# Patient Record
Sex: Male | Born: 1985 | Race: Black or African American | Hispanic: No | Marital: Single | Smoking: Never smoker
Health system: Southern US, Community
[De-identification: ages and names within clinical notes are randomized; demographics above are authoritative.]

## PROBLEM LIST (undated history)

## (undated) DIAGNOSIS — C499 Malignant neoplasm of connective and soft tissue, unspecified: Secondary | ICD-10-CM

## (undated) DIAGNOSIS — C801 Malignant (primary) neoplasm, unspecified: Secondary | ICD-10-CM

## (undated) HISTORY — PX: APPENDECTOMY: SHX54

---
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
## 2016-12-03 ENCOUNTER — Encounter (HOSPITAL_BASED_OUTPATIENT_CLINIC_OR_DEPARTMENT_OTHER): Payer: Self-pay | Admitting: Emergency Medicine

## 2016-12-03 ENCOUNTER — Emergency Department (HOSPITAL_BASED_OUTPATIENT_CLINIC_OR_DEPARTMENT_OTHER)
Admission: EM | Admit: 2016-12-03 | Discharge: 2016-12-03 | Disposition: A | Discharge status: Home / Self Care | Payer: Self-pay | Attending: Emergency Medicine | Admitting: Emergency Medicine

## 2016-12-03 DIAGNOSIS — K029 Dental caries, unspecified: Secondary | ICD-10-CM | POA: Insufficient documentation

## 2016-12-03 DIAGNOSIS — Z85828 Personal history of other malignant neoplasm of skin: Secondary | ICD-10-CM | POA: Insufficient documentation

## 2016-12-03 HISTORY — DX: Malignant (primary) neoplasm, unspecified: C80.1

## 2016-12-03 MED ORDER — PENICILLIN V POTASSIUM 250 MG PO TABS
500.0000 mg | ORAL_TABLET | Freq: Once | ORAL | Status: AC
  Administered 2016-12-03: 500 mg via ORAL
  Filled 2016-12-03: qty 2

## 2016-12-03 MED ORDER — HYDROCODONE-ACETAMINOPHEN 5-325 MG PO TABS: 1.0000 | ORAL_TABLET | Freq: Four times a day (QID) | ORAL | 0 refills | Status: DC | PRN

## 2016-12-03 MED ORDER — PENICILLIN V POTASSIUM 500 MG PO TABS: 500.0000 mg | ORAL_TABLET | Freq: Four times a day (QID) | ORAL | 0 refills | Status: DC

## 2016-12-03 MED ORDER — BUPIVACAINE-EPINEPHRINE (PF) 0.5% -1:200000 IJ SOLN
1.8000 mL | Freq: Once | INTRAMUSCULAR | Status: AC
  Administered 2016-12-03: 1.8 mL
  Filled 2016-12-03: qty 1.8

## 2016-12-03 NOTE — ED Triage Notes (Addendum)
Pt states ? Cracked tooth or cavity in R upper side. Pt states facial swelling. Denies fever, n,v. No dentist at present, pt states he just started a new job and is waiting on insurance to become effective.

## 2016-12-03 NOTE — ED Provider Notes (Addendum)
   [place] DEPT [facility] Provider Note: Georgena Spurling, MD, FACEP  CSN: [id number] MRN: [id number] ARRIVAL: 12/03/16 at [place]: [place]  Dental Pain   HISTORY OF PRESENT ILLNESS  12/03/16 3:37 AM Jonathan Lucero is a 31 y.o. male carious right upper first molar that has become painful over the past several days. He rates the pain as a 10 out of 10. It is worse with eating. The pain radiates to his right maxillary area. There is no associated fever or lymphadenopathy.  Consultation with the [facility] state controlled substances database reveals the patient has received No opioid prescriptions in the past year.   Past Medical History:  Diagnosis Date  . Cancer [facility])    skin cancer as a child    Past Surgical History:  Procedure Laterality Date  . APPENDECTOMY      No family history on file.  Social History  Substance Use Topics  . Smoking status: Never Smoker  . Smokeless tobacco: Never Used  . Alcohol use Yes     Comment: social    Prior to Admission medications   Not on File    Allergies Ativan [lorazepam]   REVIEW OF SYSTEMS  Negative except as noted here or in the History of Present Illness.   PHYSICAL EXAMINATION  Initial Vital Signs Blood pressure (!) 150/87, pulse 66, temperature 98.8 F (37.1 C), temperature source Oral, resp. rate 16, height 5\' 9"  (1.753 m), weight 59 kg (130 lb), SpO2 100 %.  Examination General: Well-developed, well-nourished male in no acute distress; appearance consistent with age of record HENT: normocephalic; atraumatic; poor dentition; carious right upper first molar with tenderness to percussion Eyes: Normal appearance Neck: supple no lymphadenopathy Heart: regular rate and rhythm Lungs: clear to auscultation bilaterally Abdomen: soft; nondistended; nontender; bowel sounds present Extremities: No deformity; full range of motion Neurologic: Awake, alert and oriented; motor function intact in all  extremities and symmetric; no facial droop Skin: Warm and dry Psychiatric: Normal mood and affect   RESULTS  Summary of this visit's results, reviewed by myself:   EKG Interpretation  Date/Time:    Ventricular Rate:    PR Interval:    QRS Duration:   QT Interval:    QTC Calculation:   R Axis:     Text Interpretation:        Laboratory Studies: No results found for this or any previous visit (from the past 24 hour(s)). Imaging Studies: No results found.  ED COURSE  Nursing notes and initial vitals signs, including pulse oximetry, reviewed.  Vitals:   12/03/16 0225 12/03/16 0226  BP:  (!) 150/87  Pulse:  66  Resp:  16  Temp:  98.8 F (37.1 C)  TempSrc:  Oral  SpO2:  100%  Weight: 59 kg (130 lb)   Height: 5\' 9"  (1.753 m)     PROCEDURES   DENTAL BLOCK 1.8 milliliters of 0.5% bupivacaine with epinephrine were injected into the buccal fold adjacent to the right upper first molar. The patient tolerated this well and there were no immediate complications. Adequate analgesia was obtained.   ED DIAGNOSES     ICD-10-CM   1. Pain due to dental caries K02.9        Jo Booze, MD 12/03/16 0347    Shanon Rosser, MD 12/03/16 [phone]

## 2017-01-21 ENCOUNTER — Emergency Department (HOSPITAL_BASED_OUTPATIENT_CLINIC_OR_DEPARTMENT_OTHER): Payer: Self-pay

## 2017-01-21 ENCOUNTER — Emergency Department (HOSPITAL_BASED_OUTPATIENT_CLINIC_OR_DEPARTMENT_OTHER)
Admission: EM | Admit: 2017-01-21 | Discharge: 2017-01-21 | Disposition: A | Discharge status: Home / Self Care | Payer: Self-pay | Attending: Emergency Medicine | Admitting: Emergency Medicine

## 2017-01-21 ENCOUNTER — Encounter (HOSPITAL_BASED_OUTPATIENT_CLINIC_OR_DEPARTMENT_OTHER): Payer: Self-pay

## 2017-01-21 DIAGNOSIS — Y999 Unspecified external cause status: Secondary | ICD-10-CM | POA: Insufficient documentation

## 2017-01-21 DIAGNOSIS — Y939 Activity, unspecified: Secondary | ICD-10-CM | POA: Insufficient documentation

## 2017-01-21 DIAGNOSIS — S39012A Strain of muscle, fascia and tendon of lower back, initial encounter: Secondary | ICD-10-CM | POA: Insufficient documentation

## 2017-01-21 DIAGNOSIS — X501XXA Overexertion from prolonged static or awkward postures, initial encounter: Secondary | ICD-10-CM | POA: Insufficient documentation

## 2017-01-21 DIAGNOSIS — Y92008 Other place in unspecified non-institutional (private) residence as the place of occurrence of the external cause: Secondary | ICD-10-CM | POA: Insufficient documentation

## 2017-01-21 LAB — URINALYSIS, ROUTINE W REFLEX MICROSCOPIC
Bilirubin Urine: NEGATIVE
Glucose, UA: NEGATIVE mg/dL
Hgb urine dipstick: NEGATIVE
Ketones, ur: NEGATIVE mg/dL
LEUKOCYTES UA: NEGATIVE
NITRITE: NEGATIVE
PROTEIN: NEGATIVE mg/dL
Specific Gravity, Urine: 1.02 (ref 1.005–1.030)
pH: 8 (ref 5.0–8.0)

## 2017-01-21 MED ORDER — NAPROXEN 500 MG PO TABS: 500.0000 mg | ORAL_TABLET | Freq: Two times a day (BID) | ORAL | 0 refills | Status: DC

## 2017-01-21 MED ORDER — CYCLOBENZAPRINE HCL 10 MG PO TABS: 10.0000 mg | ORAL_TABLET | Freq: Two times a day (BID) | ORAL | 0 refills | Status: DC | PRN

## 2017-01-21 NOTE — ED Notes (Signed)
Pt verbalizes understanding of d/c instructions and denies any further needs at this time. 

## 2017-01-21 NOTE — ED Provider Notes (Signed)
[facility] EMERGENCY DEPARTMENT Provider Note   CSN: [id number] Arrival date & time: 01/21/17  1754     History   Chief Complaint Chief Complaint  Patient presents with  . Back Pain    HPI Jonathan Lucero is a 31 y.o. male.  HPI Patient presents to ED for evaluation of bilateral lower back pain that occurred starting yesterday. He was standing up from a seated positionand his couch when he began experiencing the back pain. States that it is worse with movement and described as throbbing. He has tried ibuprofen with mild relief in symptoms. He does have a history of similar symptoms in the past due to muscle strain. He has been ambulatory but pain worse with ambulation. He denies any previous back surgery, history of IV drug use, numbness in legs, saddle anesthesia, loss of bladder function, issues with urination. 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  Past Medical History:  Diagnosis Date  . Cancer ([place])    skin cancer as a child    There are no active problems to display for this patient.   Past Surgical History:  Procedure Laterality Date  . APPENDECTOMY         Home Medications    Prior to Admission medications   Medication Sig Start Date End Date Taking? Authorizing Provider  cyclobenzaprine (FLEXERIL) 10 MG tablet Take 1 tablet (10 mg total) by mouth 2 (two) times daily as needed for muscle spasms. 01/21/17   Mekaela Azizi, PA-C  naproxen (NAPROSYN) 500 MG tablet Take 1 tablet (500 mg total) by mouth 2 (two) times daily. 01/21/17   Delia Heady, PA-C    Family History No family history on file.  Social History Social History  Substance Use Topics  . Smoking status: Never Smoker  . Smokeless tobacco: Never Used  . Alcohol use Yes     Comment: social     Allergies   Ativan [lorazepam]   Review of Systems Review of Systems  Constitutional: Negative for chills and fever.    Gastrointestinal: Negative for nausea and vomiting.  Genitourinary: Positive for flank pain. Negative for discharge, dysuria, penile pain, penile swelling and urgency.  Musculoskeletal: Positive for back pain. Negative for arthralgias and myalgias.  Skin: Negative for rash.  Neurological: Negative for weakness and numbness.     Physical Exam Updated Vital Signs BP 123/73 (BP Location: Right Arm)   Pulse 80   Temp 98.3 F (36.8 C) (Oral)   Resp 18   Ht 5\' 9"  (1.753 m)   Wt 56.7 kg (125 lb)   SpO2 100%   BMI 18.46 kg/m   Physical Exam  Constitutional: He appears well-developed and well-nourished. No distress.  HENT:  Head: Normocephalic and atraumatic.  Eyes: Conjunctivae and EOM are normal. No scleral icterus.  Neck: Normal range of motion.  Pulmonary/Chest: Effort normal. No respiratory distress.  Musculoskeletal: Normal range of motion. He exhibits tenderness. He exhibits no edema or deformity.       Arms: Tenderness to palpation of bilateral paraspinal musculature in the lumbar area. Bilateral CVA tenderness noted. No midline spinal tenderness present in lumbar, thoracic or cervical spine. No step-off palpated. No visible bruising, edema or temperature change noted. No objective signs of numbness present. No saddle anesthesia. 2+ DP pulses bilaterally. Sensation intact to light touch. Strength 5/5 in bilateral lower extremities.  Neurological: He is alert.  Skin: No rash noted. He is not diaphoretic.  Psychiatric: He has a normal mood and affect.  Nursing note and vitals reviewed.    ED Treatments / Results  Labs (all labs ordered are listed, but only abnormal results are displayed) Labs Reviewed  URINALYSIS, ROUTINE W REFLEX MICROSCOPIC    EKG  EKG Interpretation None       Radiology Dg Lumbar Spine Complete  Result Date: 01/21/2017 CLINICAL DATA:  31 y/o  M; bilateral lower back pain. EXAM: LUMBAR SPINE - COMPLETE 4+ VIEW COMPARISON:  None. FINDINGS: Mild  rotatory dextrocurvature of lumbar spine with apex at L4. Normal lumbar lordosis without listhesis. No loss of vertebral body height. Mild loss of L4-5 and L5-S1 intervertebral disc space heights. Surgical clips project over the right upper quadrant. IMPRESSION: 1. No acute fracture or dislocation. 2. Mild lumbar spine rotatory dextrocurvature and discogenic degenerative changes at L4-5 and L5-S1 level. Electronically Signed   By: Kristine Garbe M.D.   On: 01/21/2017 20:20    Procedures Procedures (including critical care time)  Medications Ordered in ED Medications - No data to display   Initial Impression / Assessment and Plan / ED Course  I have reviewed the triage vital signs and the nursing notes.  Pertinent labs & imaging results that were available during my care of the patient were reviewed by me and considered in my medical decision making (see chart for details).     Patient presents to ED for evaluation of bilateral lower back pain that occurred starting yesterday. P.m. Began after standing up from a seated position yesterday. Pain is worse with movement and described as throbbing. Mild improvement in symptoms with ibuprofen. History of similar symptoms in the past due to muscle strain. On physical exam there is tenderness to palpation as indicated in the area above. There are no signs of numbness present, no saddle anesthesia, normal strength noted bilaterally and sensation intact to light touch. No previous back surgery, history of IV drug use, urinary incontinence noted.Patient is otherwise nontoxic appearing and in no acute distress. X-ray of lumbar spine returned as negative. Urinalysis with no evidence of UTI or hematuria. I suspect that his symptoms are due to a muscle strain rather than cauda equina or other acute spinal cord injury. We'll give atient naproxen and Flexeril to be taken and advised heat therapy and stretching as tolerated. Patient appears stable for  discharge at this time. Strict return precautions given.  Final Clinical Impressions(s) / ED Diagnoses   Final diagnoses:  Strain of lumbar region, initial encounter    New Prescriptions There are no discharge medications for this patient.    Delia Heady, PA-C 01/22/17 9030    Blanchie Dessert, MD 01/22/17 2215

## 2017-01-21 NOTE — ED Triage Notes (Signed)
C/o lower back pain-started yesterday-denies injury-NAD-steady gait

## 2017-01-21 NOTE — Discharge Instructions (Signed)
Please read attached information regarding your condition. Take naproxen and Flexeril as needed for pain. Apply heat and stretch area as directed. Return to ED for worsening back pain, falls, injuries, numbness in legs, trouble walking, loss of bladder function.

## 2017-08-26 ENCOUNTER — Emergency Department (HOSPITAL_BASED_OUTPATIENT_CLINIC_OR_DEPARTMENT_OTHER)
Admission: EM | Admit: 2017-08-26 | Discharge: 2017-08-26 | Disposition: A | Discharge status: Home / Self Care | Payer: Self-pay | Attending: Physician Assistant | Admitting: Physician Assistant

## 2017-08-26 ENCOUNTER — Other Ambulatory Visit: Payer: Self-pay

## 2017-08-26 ENCOUNTER — Emergency Department (HOSPITAL_BASED_OUTPATIENT_CLINIC_OR_DEPARTMENT_OTHER): Payer: Self-pay

## 2017-08-26 ENCOUNTER — Encounter (HOSPITAL_BASED_OUTPATIENT_CLINIC_OR_DEPARTMENT_OTHER): Payer: Self-pay | Admitting: Emergency Medicine

## 2017-08-26 DIAGNOSIS — R222 Localized swelling, mass and lump, trunk: Secondary | ICD-10-CM | POA: Insufficient documentation

## 2017-08-26 DIAGNOSIS — Z85831 Personal history of malignant neoplasm of soft tissue: Secondary | ICD-10-CM | POA: Insufficient documentation

## 2017-08-26 DIAGNOSIS — I890 Lymphedema, not elsewhere classified: Secondary | ICD-10-CM

## 2017-08-26 DIAGNOSIS — Z79899 Other long term (current) drug therapy: Secondary | ICD-10-CM | POA: Insufficient documentation

## 2017-08-26 HISTORY — DX: Malignant neoplasm of connective and soft tissue, unspecified: C49.9

## 2017-08-26 LAB — COMPREHENSIVE METABOLIC PANEL
ALT: 12 U/L — ABNORMAL LOW (ref 17–63)
AST: 22 U/L (ref 15–41)
Albumin: 4.3 g/dL (ref 3.5–5.0)
Alkaline Phosphatase: 70 U/L (ref 38–126)
Anion gap: 10 (ref 5–15)
BUN: 12 mg/dL (ref 6–20)
CO2: 25 mmol/L (ref 22–32)
Calcium: 9 mg/dL (ref 8.9–10.3)
Chloride: 103 mmol/L (ref 101–111)
Creatinine, Ser: 1.58 mg/dL — ABNORMAL HIGH (ref 0.61–1.24)
GFR, EST NON AFRICAN AMERICAN: 56 mL/min — AB (ref >60)
Glucose, Bld: 134 mg/dL — ABNORMAL HIGH (ref 65–99)
POTASSIUM: 3.9 mmol/L (ref 3.5–5.1)
Sodium: 138 mmol/L (ref 135–145)
Total Bilirubin: 0.5 mg/dL (ref 0.3–1.2)
Total Protein: 8.2 g/dL — ABNORMAL HIGH (ref 6.5–8.1)

## 2017-08-26 LAB — CBC WITH DIFFERENTIAL/PLATELET
BASOS PCT: 0 %
Basophils Absolute: 0 10*3/uL (ref 0.0–0.1)
EOS ABS: 0.2 10*3/uL (ref 0.0–0.7)
EOS PCT: 3 %
HCT: 39 % (ref 39.0–52.0)
Hemoglobin: 13.4 g/dL (ref 13.0–17.0)
Lymphocytes Relative: 23 %
Lymphs Abs: 1.5 10*3/uL (ref 0.7–4.0)
MCH: 29.5 pg (ref 26.0–34.0)
MCHC: 34.4 g/dL (ref 30.0–36.0)
MCV: 85.9 fL (ref 78.0–100.0)
MONOS PCT: 16 %
Monocytes Absolute: 1 10*3/uL (ref 0.1–1.0)
NEUTROS PCT: 58 %
Neutro Abs: 3.7 10*3/uL (ref 1.7–7.7)
Platelets: 180 10*3/uL (ref 150–400)
RBC: 4.54 MIL/uL (ref 4.22–5.81)
RDW: 13.3 % (ref 11.5–15.5)
WBC: 6.3 10*3/uL (ref 4.0–10.5)

## 2017-08-26 MED ORDER — IOPAMIDOL (ISOVUE-300) INJECTION 61%
100.0000 mL | Freq: Once | INTRAVENOUS | Status: AC | PRN
  Administered 2017-08-26: 100 mL via INTRAVENOUS

## 2017-08-26 MED ORDER — IOPAMIDOL (ISOVUE-300) INJECTION 61%: 15.0000 mL | Freq: Once | INTRAVENOUS | Status: DC | PRN

## 2017-08-26 NOTE — Discharge Instructions (Addendum)
It is unclear what this mass in your supraclavicular area is.  We have printed out the results for you.  We recommend that you call today to follow-up with your primary oncologist at [facility] will likely need an MRI to better characterize this.  You also may need a biopsy.  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

## 2017-08-26 NOTE — ED Triage Notes (Signed)
patient states that he has had a spot on his neck x 2 weeks

## 2017-08-26 NOTE — ED Notes (Signed)
Pt has swollen area around the supraclavicular lymph node area.  Denies fever.  Denies trauma.  States it has been present for 2 weeks.

## 2017-08-26 NOTE — ED Provider Notes (Addendum)
[facility] EMERGENCY DEPARTMENT Provider Note   CSN: [id number] Arrival date & time: 08/26/17  0751     History   Chief Complaint Chief Complaint  Patient presents with  . Lymphadenopathy    HPI Jonathan Lucero is a 32 y.o. male.  HPI   Patient is a 32 year old male presenting with swelling to his left neck.  The swelling is located over his left super clavicular laterally.  Patient reports is been on for last month.  And getting larger.  Patient reports mild allergy-like symptoms.  Otherwise no history of fevers.  No issues otherwise.  Patient denies any recent weight loss.  Denies any B symptoms such as night sweats.  He reports feeling his usual state of health.  Past Medical History:  Diagnosis Date  . Cancer ([place])    skin cancer as a child  . 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   Past Surgical History:  Procedure Laterality Date  . APPENDECTOMY          Home Medications    Prior to Admission medications   Medication Sig Start Date End Date Taking? Authorizing Provider  cyclobenzaprine (FLEXERIL) 10 MG tablet Take 1 tablet (10 mg total) by mouth 2 (two) times daily as needed for muscle spasms. 01/21/17   Khatri, Hina, PA-C  naproxen (NAPROSYN) 500 MG tablet Take 1 tablet (500 mg total) by mouth 2 (two) times daily. 01/21/17   Delia Heady, PA-C    Family History History reviewed. No pertinent family history.  Social History Social History   Tobacco Use  . Smoking status: Never Smoker  . Smokeless tobacco: Never Used  Substance Use Topics  . Alcohol use: Yes    Comment: social  . Drug use: Yes    Types: Marijuana     Allergies   Ativan [lorazepam]   Review of Systems Review of Systems  Constitutional: Negative for activity change, appetite change, fatigue, fever and unexpected weight change.  HENT: Negative for congestion, mouth sores, sinus pressure, sore throat, trouble swallowing and  voice change.   Respiratory: Negative for shortness of breath.   Cardiovascular: Negative for chest pain.  Gastrointestinal: Negative for abdominal pain.  Genitourinary: Negative for difficulty urinating.  Musculoskeletal: Negative for back pain.  Allergic/Immunologic: Negative for immunocompromised state.  All other systems reviewed and are negative.    Physical Exam Updated Vital Signs BP 130/78   Pulse 75   Temp 97.7 F (36.5 C) (Oral)   Resp 16   Ht 5\' 9"  (1.753 m)   Wt 56.7 kg (125 lb)   SpO2 99%   BMI 18.46 kg/m   Physical Exam  Constitutional: He is oriented to person, place, and time. He appears well-nourished.  HENT:  Head: Normocephalic.  Eyes: Conjunctivae are normal.  Cardiovascular: Normal rate and regular rhythm.  No murmur heard. Pulmonary/Chest: Effort normal and breath sounds normal. No stridor. No respiratory distress.  Swelling to the left supraclavicular area.  Nonmobile.  Nontender.  4 x 4 mass.  Abdominal: Soft. He exhibits no distension. There is no tenderness.  Neurological: He is oriented to person, place, and time. No cranial nerve deficit.  Skin: Skin is warm and dry. He is not diaphoretic.  Psychiatric: He has a normal mood and affect. His behavior is normal.     ED Treatments / Results  Labs (all labs ordered are listed, but only abnormal results are displayed) Labs Reviewed  COMPREHENSIVE METABOLIC  PANEL - Abnormal; Notable for the following components:      Result Value   Glucose, Bld 134 (*)    Creatinine, Ser 1.58 (*)    Total Protein 8.2 (*)    ALT 12 (*)    GFR calc non Af Amer 56 (*)    All other components within normal limits  CBC WITH DIFFERENTIAL/PLATELET    EKG None  Radiology Ct Soft Tissue Neck W Contrast  Result Date: 08/26/2017 CLINICAL DATA:  Left supraclavicular lump. 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 EXAM: CT NECK WITH CONTRAST TECHNIQUE: Multidetector CT imaging of the neck was  performed using the standard protocol following the bolus administration of intravenous contrast. CONTRAST:  17mL ISOVUE-300 IOPAMIDOL (ISOVUE-300) INJECTION 61% COMPARISON:  CT chest with contrast 08/25/2014 FINDINGS: Pharynx and larynx: No focal mucosal or submucosal lesions are present. Salivary glands: Submandibular and parotid glands are normal bilaterally. Thyroid: Normal Lymph nodes: No significant upper cervical adenopathy is present. A heterogeneous left supraclavicular mass measures 6.1 x 2.9 x 2.5 cm. A smaller more homogeneous lesion was present on the prior exam. Vascular: Within normal limits Limited intracranial: Unremarkable Visualized orbits: Within normal limits. No optic nerve mass is present. Mastoids and visualized paranasal sinuses: A polyp or mucous retention cyst is present inferiorly within the left maxillary sinus. Skeleton: There is some reversal of the normal cervical lordosis. Vertebral body heights are maintained. Alignment is anatomic. Sphenoid bone is normal bilaterally. Mandible is intact and located. Upper chest: The lung apices are clear. Other: Nerve roots appear to be enlarged bilaterally. IMPRESSION: 1. Heterogeneous mass lesion in the left supraclavicular station measuring 6.1 x 2.9 x 2.5 cm. 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 A cystic lesion was present previously. This may represent a degenerated schwannoma or plexiform neurofibroma. Lymphangioma is considered less likely. Although the patient has a distant history of rhabdomyosarcoma, metastatic disease is considered unlikely. Recommend MRI of the neck without and with contrast for further evaluation prior to biopsy. 2. No other significant cervical adenopathy or other primary lesion. Electronically Signed   By: San Morelle M.D.   On: 08/26/2017 09:43   Ct Chest W Contrast  Result Date: 08/26/2017 CLINICAL DATA:  History of left supraclavicular palpable abnormality EXAM: CT  CHEST, ABDOMEN, AND PELVIS WITH CONTRAST TECHNIQUE: Multidetector CT imaging of the chest, abdomen and pelvis was performed following the standard protocol during bolus administration of intravenous contrast. CONTRAST:  184mL ISOVUE-300 IOPAMIDOL (ISOVUE-300) INJECTION 61% COMPARISON:  None. FINDINGS: CT CHEST FINDINGS Cardiovascular: Thoracic aorta is within normal limits. No cardiac enlargement is seen. Pulmonary artery as visualized is within normal limits. Mediastinum/Nodes: Thoracic inlet demonstrates evidence of a 4.4 x 2.4 cm mixed enhancing lesion in the left supraclavicular region. No lymphadenopathy is noted. The hila and mediastinum show no significant adenopathy. The esophagus is within normal limits. No axillary adenopathy is identified. Lungs/Pleura: Lungs are well aerated bilaterally. Stable right middle lobe nodule is seen with mild calcification within. Lungs are otherwise clear without focal infiltrate or sizable effusion. Musculoskeletal: No acute bony abnormality is noted. Stable changes in the scapula bilaterally are seen. CT ABDOMEN PELVIS FINDINGS Hepatobiliary: No focal liver abnormality is seen. No gallstones, gallbladder wall thickening, or biliary dilatation. Pancreas: Unremarkable. No pancreatic ductal dilatation or surrounding inflammatory changes. Spleen: Normal in size without focal abnormality. Adrenals/Urinary Tract: The right adrenal gland and kidney have been surgically removed consistent with the given clinical history. The left kidney  is within normal limits. The bladder is well distended. Stomach/Bowel: Appendix is not visualized consistent with prior surgical history. Fecal material is noted throughout the colon without obstructive change. No inflammatory abnormalities of the bowel are seen. Vascular/Lymphatic: No significant vascular findings are present. No enlarged abdominal or pelvic lymph nodes. Reproductive: Prostate is unremarkable. Other: No abdominal wall hernia or  abnormality. No abdominopelvic ascites. Musculoskeletal: No acute or significant osseous findings. IMPRESSION: 4.4 x 2.4 cm enhancing lesion in the left supraclavicular region. When compared with the prior CT examination, this lesion was present but not well appreciated due to the timing of the contrast bolus. MR of the neck may be helpful for further workup on a non emergent basis Postsurgical changes consistent with the given clinical history. Stable right middle lobe nodule. Stable bony changes as previously described. Electronically Signed   By: Inez Catalina M.D.   On: 08/26/2017 09:42   Ct Abdomen Pelvis W Contrast  Result Date: 08/26/2017 CLINICAL DATA:  History of left supraclavicular palpable abnormality EXAM: CT CHEST, ABDOMEN, AND PELVIS WITH CONTRAST TECHNIQUE: Multidetector CT imaging of the chest, abdomen and pelvis was performed following the standard protocol during bolus administration of intravenous contrast. CONTRAST:  152mL ISOVUE-300 IOPAMIDOL (ISOVUE-300) INJECTION 61% COMPARISON:  None. FINDINGS: CT CHEST FINDINGS Cardiovascular: Thoracic aorta is within normal limits. No cardiac enlargement is seen. Pulmonary artery as visualized is within normal limits. Mediastinum/Nodes: Thoracic inlet demonstrates evidence of a 4.4 x 2.4 cm mixed enhancing lesion in the left supraclavicular region. No lymphadenopathy is noted. The hila and mediastinum show no significant adenopathy. The esophagus is within normal limits. No axillary adenopathy is identified. Lungs/Pleura: Lungs are well aerated bilaterally. Stable right middle lobe nodule is seen with mild calcification within. Lungs are otherwise clear without focal infiltrate or sizable effusion. Musculoskeletal: No acute bony abnormality is noted. Stable changes in the scapula bilaterally are seen. CT ABDOMEN PELVIS FINDINGS Hepatobiliary: No focal liver abnormality is seen. No gallstones, gallbladder wall thickening, or biliary dilatation. Pancreas:  Unremarkable. No pancreatic ductal dilatation or surrounding inflammatory changes. Spleen: Normal in size without focal abnormality. Adrenals/Urinary Tract: The right adrenal gland and kidney have been surgically removed consistent with the given clinical history. The left kidney is within normal limits. The bladder is well distended. Stomach/Bowel: Appendix is not visualized consistent with prior surgical history. Fecal material is noted throughout the colon without obstructive change. No inflammatory abnormalities of the bowel are seen. Vascular/Lymphatic: No significant vascular findings are present. No enlarged abdominal or pelvic lymph nodes. Reproductive: Prostate is unremarkable. Other: No abdominal wall hernia or abnormality. No abdominopelvic ascites. Musculoskeletal: No acute or significant osseous findings. IMPRESSION: 4.4 x 2.4 cm enhancing lesion in the left supraclavicular region. When compared with the prior CT examination, this lesion was present but not well appreciated due to the timing of the contrast bolus. MR of the neck may be helpful for further workup on a non emergent basis Postsurgical changes consistent with the given clinical history. Stable right middle lobe nodule. Stable bony changes as previously described. Electronically Signed   By: Inez Catalina M.D.   On: 08/26/2017 09:42    Procedures Procedures (including critical care time)  Medications Ordered in ED Medications  iopamidol (ISOVUE-300) 61 % injection 100 mL (100 mLs Intravenous Contrast Given 08/26/17 0906)     Initial Impression / Assessment and Plan / ED Course  I have reviewed the triage vital signs and the nursing notes.  Pertinent labs & imaging results that  were available during my care of the patient were reviewed by me and considered in my medical decision making (see chart for details).    Patient is a 32 year old male presenting with swelling to his left neck.  The swelling is located over his left  super clavicular laterally.  Patient reports is been on for last month.  And getting larger.  Patient reports mild allergy-like symptoms.  Otherwise no history of fevers.  No issues otherwise.  Patient denies any recent weight loss.  Denies any B symptoms such as night sweats.  He reports feeling his usual state of health.  3:41 PM Swelling concerning for Virch[place] node.  Will get CT abdomen and pelvis in addition to the CT neck.  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  Concern for recurrence..  CT shows just large surpaclavicular mass. Discussed with patient, CT results printed. He will call oncologist today for follow up and other imaging.    Final Clinical Impressions(s) / ED Diagnoses   Final diagnoses:  Supraclavicular mass    ED Discharge Orders    None       Macarthur Critchley, MD 08/26/17 1541    Macarthur Critchley, MD 08/26/17 1541

## 2019-02-13 ENCOUNTER — Encounter (HOSPITAL_BASED_OUTPATIENT_CLINIC_OR_DEPARTMENT_OTHER): Payer: Self-pay | Admitting: Emergency Medicine

## 2019-02-13 ENCOUNTER — Other Ambulatory Visit: Payer: Self-pay

## 2019-02-13 ENCOUNTER — Emergency Department (HOSPITAL_BASED_OUTPATIENT_CLINIC_OR_DEPARTMENT_OTHER): Payer: BC Managed Care – PPO

## 2019-02-13 ENCOUNTER — Emergency Department (HOSPITAL_BASED_OUTPATIENT_CLINIC_OR_DEPARTMENT_OTHER)
Admission: EM | Admit: 2019-02-13 | Discharge: 2019-02-13 | Disposition: A | Discharge status: Home / Self Care | Payer: BC Managed Care – PPO | Attending: Emergency Medicine | Admitting: Emergency Medicine

## 2019-02-13 DIAGNOSIS — M899 Disorder of bone, unspecified: Secondary | ICD-10-CM | POA: Insufficient documentation

## 2019-02-13 DIAGNOSIS — K769 Liver disease, unspecified: Secondary | ICD-10-CM | POA: Diagnosis not present

## 2019-02-13 DIAGNOSIS — R1013 Epigastric pain: Secondary | ICD-10-CM | POA: Diagnosis present

## 2019-02-13 LAB — COMPREHENSIVE METABOLIC PANEL
ALT: 17 U/L (ref 0–44)
AST: 24 U/L (ref 15–41)
Albumin: 4.3 g/dL (ref 3.5–5.0)
Alkaline Phosphatase: 68 U/L (ref 38–126)
Anion gap: 9 (ref 5–15)
BUN: 16 mg/dL (ref 6–20)
CO2: 27 mmol/L (ref 22–32)
Calcium: 9.3 mg/dL (ref 8.9–10.3)
Chloride: 103 mmol/L (ref 98–111)
Creatinine, Ser: 1.41 mg/dL — ABNORMAL HIGH (ref 0.61–1.24)
GFR calc Af Amer: >60 mL/min (ref >60)
GFR calc non Af Amer: >60 mL/min (ref >60)
Glucose, Bld: 99 mg/dL (ref 70–99)
Potassium: 3.9 mmol/L (ref 3.5–5.1)
Sodium: 139 mmol/L (ref 135–145)
Total Bilirubin: 0.6 mg/dL (ref 0.3–1.2)
Total Protein: 8.2 g/dL — ABNORMAL HIGH (ref 6.5–8.1)

## 2019-02-13 LAB — CBC WITH DIFFERENTIAL/PLATELET
Abs Immature Granulocytes: 0.01 10*3/uL (ref 0.00–0.07)
Basophils Absolute: 0.1 10*3/uL (ref 0.0–0.1)
Basophils Relative: 1 %
Eosinophils Absolute: 0.5 10*3/uL (ref 0.0–0.5)
Eosinophils Relative: 5 %
HCT: 41.2 % (ref 39.0–52.0)
Hemoglobin: 13.1 g/dL (ref 13.0–17.0)
Immature Granulocytes: 0 %
Lymphocytes Relative: 28 %
Lymphs Abs: 2.7 10*3/uL (ref 0.7–4.0)
MCH: 28.8 pg (ref 26.0–34.0)
MCHC: 31.8 g/dL (ref 30.0–36.0)
MCV: 90.5 fL (ref 80.0–100.0)
Monocytes Absolute: 0.8 10*3/uL (ref 0.1–1.0)
Monocytes Relative: 8 %
Neutro Abs: 5.7 10*3/uL (ref 1.7–7.7)
Neutrophils Relative %: 58 %
Platelets: 253 10*3/uL (ref 150–400)
RBC: 4.55 MIL/uL (ref 4.22–5.81)
RDW: 14.1 % (ref 11.5–15.5)
WBC: 9.7 10*3/uL (ref 4.0–10.5)
nRBC: 0 % (ref 0.0–0.2)

## 2019-02-13 LAB — URINALYSIS, ROUTINE W REFLEX MICROSCOPIC
Bilirubin Urine: NEGATIVE
Glucose, UA: NEGATIVE mg/dL
Hgb urine dipstick: NEGATIVE
Ketones, ur: NEGATIVE mg/dL
Leukocytes,Ua: NEGATIVE
Nitrite: NEGATIVE
Protein, ur: NEGATIVE mg/dL
Specific Gravity, Urine: 1.015 (ref 1.005–1.030)
pH: 8 (ref 5.0–8.0)

## 2019-02-13 LAB — D-DIMER, QUANTITATIVE (NOT AT ARMC): D-Dimer, Quant: 1.01 ug/mL-FEU — ABNORMAL HIGH (ref 0.00–0.50)

## 2019-02-13 LAB — TROPONIN I (HIGH SENSITIVITY): Troponin I (High Sensitivity): 3 ng/L (ref <18)

## 2019-02-13 LAB — LIPASE, BLOOD: Lipase: 24 U/L (ref 11–51)

## 2019-02-13 MED ORDER — HYDROCODONE-ACETAMINOPHEN 5-325 MG PO TABS: 1.0000 | ORAL_TABLET | Freq: Four times a day (QID) | ORAL | 0 refills | Status: DC | PRN

## 2019-02-13 MED ORDER — ONDANSETRON 8 MG PO TBDP: 8.0000 mg | ORAL_TABLET | Freq: Three times a day (TID) | ORAL | 0 refills | Status: DC | PRN

## 2019-02-13 MED ORDER — ONDANSETRON HCL 4 MG/2ML IJ SOLN
4.0000 mg | Freq: Once | INTRAMUSCULAR | Status: AC
  Administered 2019-02-13: 4 mg via INTRAVENOUS
  Filled 2019-02-13: qty 2

## 2019-02-13 MED ORDER — IOHEXOL 350 MG/ML SOLN
75.0000 mL | Freq: Once | INTRAVENOUS | Status: AC | PRN
  Administered 2019-02-13: 75 mL via INTRAVENOUS

## 2019-02-13 NOTE — ED Triage Notes (Signed)
Pt c/o epigastric pain since yesterday. Pt also c/o right axillary pain and pressure in chest. Pt reports vomiting prior to coming to ED

## 2019-02-13 NOTE — ED Notes (Signed)
ED Provider at bedside. 

## 2019-02-13 NOTE — ED Provider Notes (Signed)
[facility] [facility] Provider Note: Georgena Spurling, MD, FACEP  CSN: [id number] MRN: [id number] ARRIVAL: 02/13/19 at [place]: [place]  Abdominal Pain   HISTORY OF PRESENT ILLNESS  02/13/19 2:42 AM Jonathan Lucero is a 33 y.o. 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  So far as he knows he is now tumor free.  He is here with abdominal pain to the right of his epigastrium (up under the right lower costal margin) that has been present for about a week.  It worsened yesterday evening.  He describes it as a dull pain which he rates as an 8 out of 10.  It is not changed with movement or palpation, but slightly worse with deep breathing.  He had one episode of vomiting just prior to arrival but is not currently nauseated.  He has had no diarrhea with this.  Since yesterday evening he has also had a right, mild, pressure in his chest which is not changed with palpation or breathing.  He also has a similar pain in the area of his right scapula, also not changed with movement or deep breathing.  He is not short of breath nor coughing.   Past Medical History:  Diagnosis Date   Cancer ([place])    skin cancer as a child   Rhabdosarcoma [facility])     Past Surgical History:  Procedure Laterality Date   APPENDECTOMY      No family history on file.  Social History   Tobacco Use   Smoking status: Never Smoker   Smokeless tobacco: Never Used  Substance Use Topics   Alcohol use: Yes    Comment: social   Drug use: Yes    Types: Marijuana    Prior to Admission medications   Not on File    Allergies Ativan [lorazepam]   REVIEW OF SYSTEMS  Negative except as noted here or in the History of Present Illness.   PHYSICAL EXAMINATION  Initial Vital Signs Blood pressure (!) 148/93, pulse (!) 48, temperature 98.2 F (36.8 C), temperature source Oral, resp. rate  14, height 5\' 9"  (1.753 m), weight 59 kg, SpO2 100 %.  Examination General: Well-developed, thin but muscular male in no acute distress; appearance consistent with age of record HENT: normocephalic; atraumatic Eyes: pupils equal, round and reactive to light; extraocular muscles intact Neck: supple Heart: regular rate and rhythm Lungs: clear to auscultation bilaterally Chest: Nontender Abdomen: soft; nondistended; nontender; no masses or hepatosplenomegaly; bowel sounds present Back: Nontender Extremities: No deformity; full range of motion; pulses normal; no edema Neurologic: Awake, alert and oriented; motor function intact in all extremities and symmetric; no facial droop Skin: Warm and dry Psychiatric: Normal mood and affect   RESULTS  Summary of this visit's results, reviewed and interpreted by myself:   EKG Interpretation  Date/Time:  Friday February 13 2019 02:37:48 EST Ventricular Rate:  52 PR Interval:    QRS Duration: 75 QT Interval:  425 QTC Calculation: 396 R Axis:   79 Text Interpretation: Sinus rhythm Consider left ventricular hypertrophy Abnrm T, probable ischemia, anterolateral lds ST elev, probable normal early repol pattern No previous ECGs available Confirmed by Shanon Rosser [phone]) on 02/13/2019 2:40:17 AM      Laboratory Studies: Results for orders placed or performed during the hospital encounter of 02/13/19 (from the past 24 hour(s))  CBC with Differential  Status: None   Collection Time: 02/13/19  2:41 AM  Result Value Ref Range   WBC 9.7 4.0 - 10.5 K/uL   RBC 4.55 4.22 - 5.81 MIL/uL   Hemoglobin 13.1 13.0 - 17.0 g/dL   HCT 41.2 39.0 - 52.0 %   MCV 90.5 80.0 - 100.0 fL   MCH 28.8 26.0 - 34.0 pg   MCHC 31.8 30.0 - 36.0 g/dL   RDW 14.1 11.5 - 15.5 %   Platelets 253 150 - 400 K/uL   nRBC 0.0 0.0 - 0.2 %   Neutrophils Relative % 58 %   Neutro Abs 5.7 1.7 - 7.7 K/uL   Lymphocytes Relative 28 %   Lymphs Abs 2.7 0.7 - 4.0 K/uL   Monocytes Relative  8 %   Monocytes Absolute 0.8 0.1 - 1.0 K/uL   Eosinophils Relative 5 %   Eosinophils Absolute 0.5 0.0 - 0.5 K/uL   Basophils Relative 1 %   Basophils Absolute 0.1 0.0 - 0.1 K/uL   Immature Granulocytes 0 %   Abs Immature Granulocytes 0.01 0.00 - 0.07 K/uL  Comprehensive metabolic panel     Status: Abnormal   Collection Time: 02/13/19  2:41 AM  Result Value Ref Range   Sodium 139 135 - 145 mmol/L   Potassium 3.9 3.5 - 5.1 mmol/L   Chloride 103 98 - 111 mmol/L   CO2 27 22 - 32 mmol/L   Glucose, Bld 99 70 - 99 mg/dL   BUN 16 6 - 20 mg/dL   Creatinine, Ser 1.41 (H) 0.61 - 1.24 mg/dL   Calcium 9.3 8.9 - 10.3 mg/dL   Total Protein 8.2 (H) 6.5 - 8.1 g/dL   Albumin 4.3 3.5 - 5.0 g/dL   AST 24 15 - 41 U/L   ALT 17 0 - 44 U/L   Alkaline Phosphatase 68 38 - 126 U/L   Total Bilirubin 0.6 0.3 - 1.2 mg/dL   GFR calc non Af Amer >60 >60 mL/min   GFR calc Af Amer >60 >60 mL/min   Anion gap 9 5 - 15  Lipase, blood     Status: None   Collection Time: 02/13/19  2:41 AM  Result Value Ref Range   Lipase 24 11 - 51 U/L  D-dimer, quantitative (not at [facility])     Status: Abnormal   Collection Time: 02/13/19  2:41 AM  Result Value Ref Range   D-Dimer, Quant 1.01 (H) 0.00 - 0.50 ug/mL-FEU  Troponin I (High Sensitivity)     Status: None   Collection Time: 02/13/19  2:41 AM  Result Value Ref Range   Troponin I (High Sensitivity) 3 <18 ng/L  Urinalysis, Routine w reflex microscopic     Status: Abnormal   Collection Time: 02/13/19  4:21 AM  Result Value Ref Range   Color, Urine YELLOW YELLOW   APPearance CLOUDY (A) CLEAR   Specific Gravity, Urine 1.015 1.005 - 1.030   pH 8.0 5.0 - 8.0   Glucose, UA NEGATIVE NEGATIVE mg/dL   Hgb urine dipstick NEGATIVE NEGATIVE   Bilirubin Urine NEGATIVE NEGATIVE   Ketones, ur NEGATIVE NEGATIVE mg/dL   Protein, ur NEGATIVE NEGATIVE mg/dL   Nitrite NEGATIVE NEGATIVE   Leukocytes,Ua NEGATIVE NEGATIVE   Imaging Studies: Cxr Pa/lat  Result Date:  02/13/2019 CLINICAL DATA:  Epigastric pain, chest pain EXAM: CHEST - 2 VIEW COMPARISON:  None. FINDINGS: The heart size and mediastinal contours are within normal limits. Both lungs are clear. The visualized skeletal structures are unremarkable. IMPRESSION:  No active cardiopulmonary disease. Electronically Signed   By: Rolm Baptise M.D.   On: 02/13/2019 03:16   Ct Angio Chest Pe W And/or Wo Contrast  Result Date: 02/13/2019 CLINICAL DATA:  Epigastric and right axillary pain x2 days with chest pressure in elevated D-dimer EXAM: CT ANGIOGRAPHY CHEST WITH CONTRAST TECHNIQUE: Multidetector CT imaging of the chest was performed using the standard protocol during bolus administration of intravenous contrast. Multiplanar CT image reconstructions and MIPs were obtained to evaluate the vascular anatomy. CONTRAST:  21mL OMNIPAQUE IOHEXOL 350 MG/ML SOLN COMPARISON:  The patient's prior CTs could not be retrieved for comparison. FINDINGS: Cardiovascular: Contrast injection is sufficient to demonstrate satisfactory opacification of the pulmonary arteries to the segmental level. There is no pulmonary embolus. The main pulmonary artery is within normal limits for size. There is no CT evidence of acute right heart strain. The visualized aorta is normal. Heart size is normal, without pericardial effusion. Mediastinum/Nodes: --No mediastinal or hilar lymphadenopathy. --No axillary lymphadenopathy. --No supraclavicular lymphadenopathy. --Normal thyroid gland. --The esophagus is unremarkable Lungs/Pleura: There is a 3 mm pulmonary nodule in the posterior right upper lobe (axial series 5, image 47). There is a 6 mm pulmonary nodule in the right middle lobe (axial series 5 image 77). This is relatively stable per report since 2016. There is no pneumothorax. No pleural effusion. The trachea is unremarkable. Upper Abdomen: The patient is likely status post prior right nephrectomy. There is a partially visualized hyperenhancing 1.6 cm  area in the region of hepatic segment 4A. Musculoskeletal: There are mixed sclerotic and lytic areas involving both scapula. There is no displaced fracture. There is questionable asymmetric enlargement of the left stay sternocleidomastoid muscle. Review of the MIP images confirms the above findings. IMPRESSION: 1. No acute pulmonary embolus. 2. Partially visualized hypervascular region in hepatic segment 4A. This is of unknown clinical significance. Further evaluation with a contrast enhanced CT of the abdomen is recommended. Unfortunately, the patient's prior CTs could not be retrieved for comparison. However, there is no mention of a hepatic lesion on these CTs. 3. Questionable asymmetric enlargement of the left sternocleidomastoid muscle of unknown clinical significance. Clinical correlation is recommended. This can be further evaluated with ultrasound as clinically indicated. 4. Subtle mixed sclerotic and lytic areas involving the bilateral scapula. This is of unknown clinical significance and can be correlated with the patient's prior CTs when available. 5. Small 3 mm pulmonary nodule in the right upper lobe. Stable 5 mm pulmonary nodule in the right middle lobe per comparison to prior reports. 6. The patient appears to be status post right-sided nephrectomy. Electronically Signed   By: Constance Holster M.D.   On: 02/13/2019 04:25    ED COURSE and MDM  Nursing notes, initial and subsequent vitals signs, including pulse oximetry, reviewed and interpreted by myself.  Vitals:   02/13/19 0236 02/13/19 0239  BP:  (!) 148/93  Pulse:  (!) 48  Resp:  14  Temp:  98.2 F (36.8 C)  TempSrc:  Oral  SpO2:  100%  Weight: 59 kg   Height: 5\' 9"  (1.753 m)    Medications  ondansetron (ZOFRAN) injection 4 mg (4 mg Intravenous Given 02/13/19 0315)  iohexol (OMNIPAQUE) 350 MG/ML injection 75 mL (75 mLs Intravenous Contrast Given 02/13/19 0354)   4:49 AM Patient was advised of the findings of the CT scan.   Specifically he was advised of the lytic and sclerotic lesion seen in the scapulae as well as the liver lesion.  Due  to elevated creatinine I do not believe it is appropriate to perform an abdominal CT scan with contrast at this time as he just received contrast for his chest CT.  He does not currently have a PCP but states he will find one.  We will give him referral numbers. The CT scan does not show any lesions such as pulmonary embolism, or infection that requires emergent intervention.  Discharge instructions given:  The cause of your pain is not fully determined at that time.  There is no evidence of a blood clot or infection in your chest or upper abdomen.  The CT scan did show some lesions in your shoulder blades as well as a lesion of your liver.  Further evaluation of these lesions is recommended but it is not recommended we do another CT scan in the ED as the additional IV dye might cause damage to your kidneys.  Drink plenty of fluids over the next 24 hours to help flush out this contrast.  You need to establish with a primary care physician to arrange follow-up for these lesions as cancer cannot be completely eliminated.  PROCEDURES  Procedures   ED DIAGNOSES     ICD-10-CM   1. Lytic bone lesions on xray  M89.9   2. Lesion of liver  K76.9        Akita Maxim, Jenny Reichmann, MD 02/13/19 [phone]

## 2019-02-13 NOTE — Discharge Instructions (Addendum)
The cause of your pain is not fully determined at that time.  There is no evidence of a blood clot or infection in your chest or upper abdomen.  The CT scan did show some lesions in your shoulder blades as well as a lesion of your liver.  Further evaluation of these lesions is recommended but it is not recommended we do another CT scan in the ED as the additional IV dye might cause damage to your kidneys.  Drink plenty of fluids over the next 24 hours to help flush out this contrast.  You need to establish with a primary care physician to arrange follow-up for these lesions as cancer cannot be completely eliminated.  If your symptoms worsen or change you are always welcome to be seen in the ED again.  If your abdominal pain worsens in a day or 2 we would consider repeating the CT scan at that time.

## 2019-03-24 ENCOUNTER — Ambulatory Visit: Payer: Medicaid Other | Admitting: Family

## 2019-04-27 IMAGING — CT CT NECK W/ CM
2 of 10 series · 6 of 33 positions shown, 7 images · IV contrast (iopamidol)
Comparison: CT chest with contrast 08/25/2014

CLINICAL DATA: Left supraclavicular lump. Ruptured rhabdomyosarcoma
treated with radiation 30 years ago.

EXAM:
CT NECK WITH CONTRAST
TECHNIQUE: Multidetector CT imaging of the neck was performed using the
standard protocol following the bolus administration of intravenous
contrast.
CONTRAST:  100mL 5DH2CE-O00 IOPAMIDOL (5DH2CE-O00) INJECTION 61%

[Series 2: cap with 2 · axial · 0.64mm/px · z∈[-803,-143]mm · 3 of 133 slices shown, 4 images]
[im 1/133  soft-tissue]
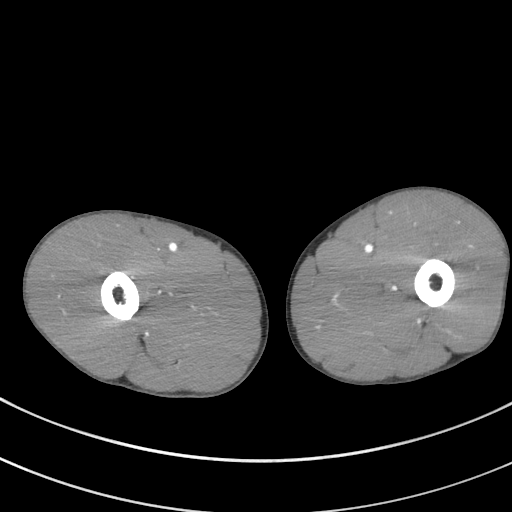
[im 1/133  bone]
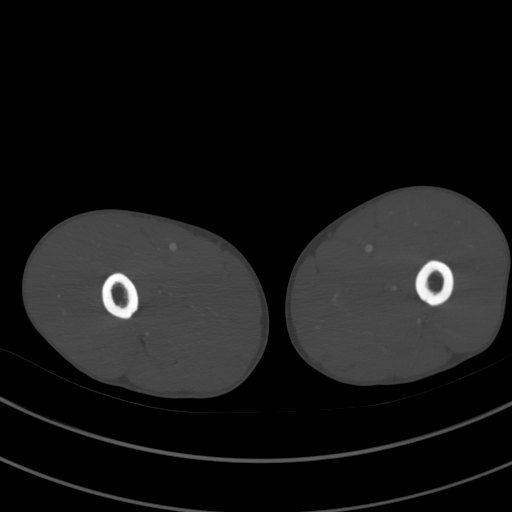
[im 67/133  bone]
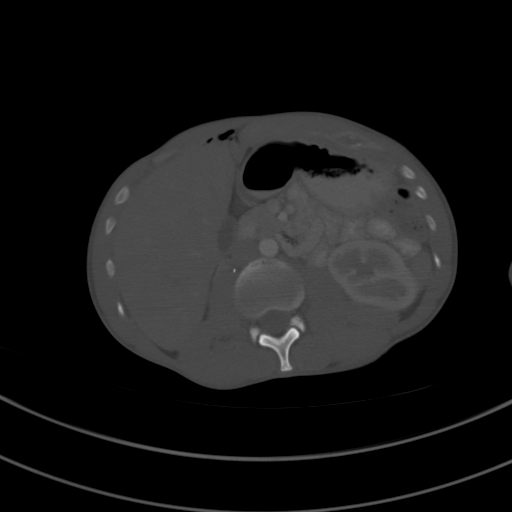
[im 133/133  bone]
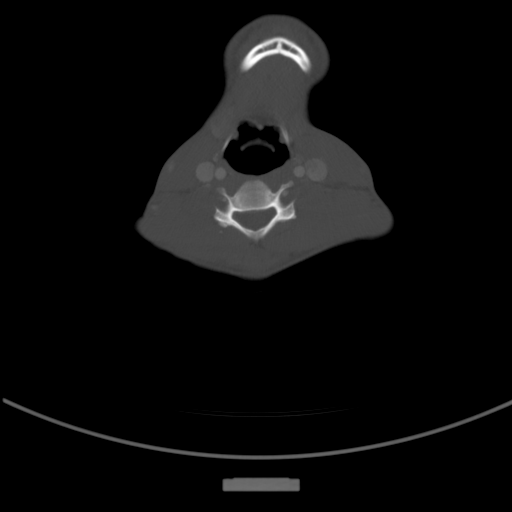

[Series 7: coronals · coronal · 0.69mm/px · 3 of 111 slices shown]
[im 28/111  bone]
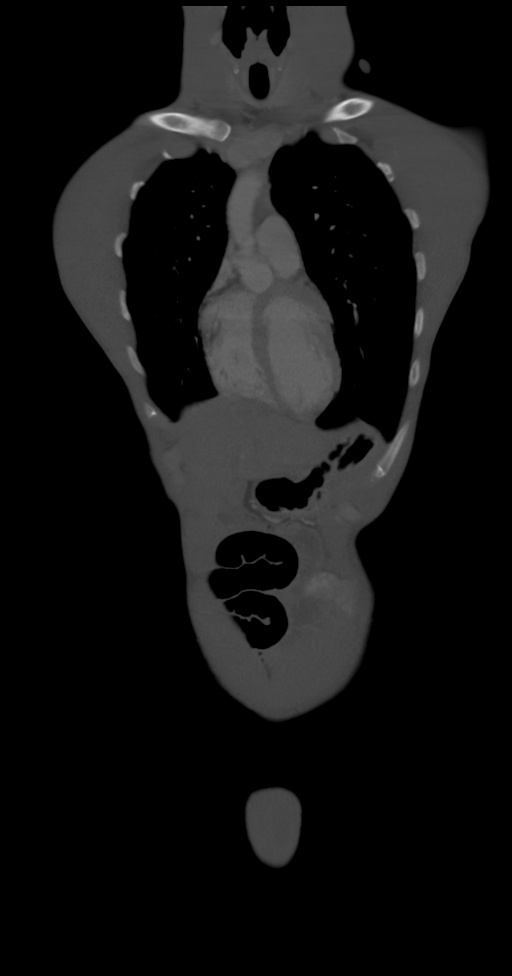
[im 56/111  bone]
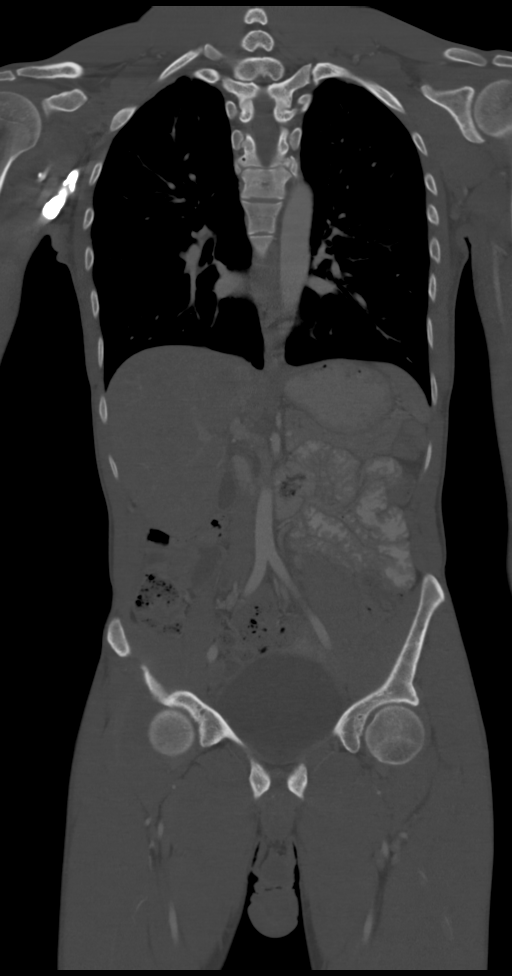
[im 83/111  bone]
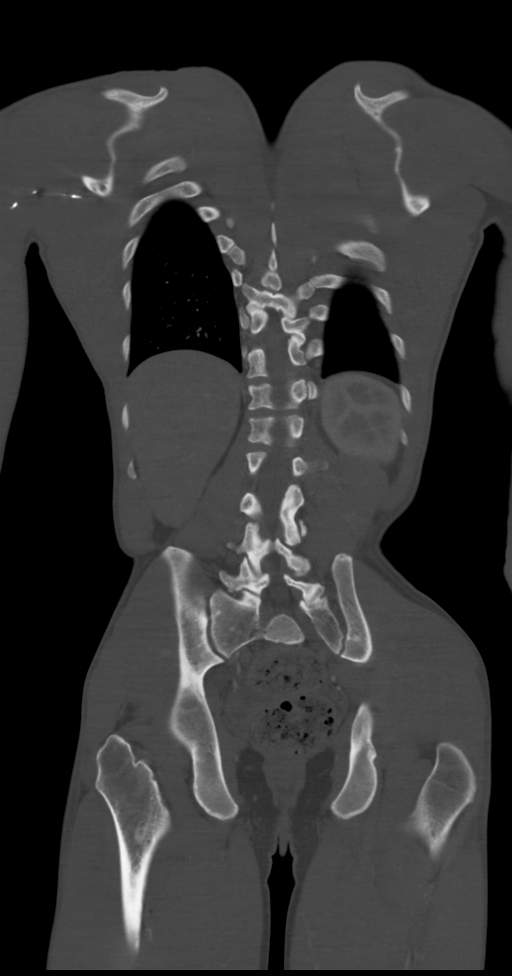

[6 of 33 positions shown; findings below may reference images not displayed]

FINDINGS: Pharynx and larynx: No focal mucosal or submucosal lesions are
present.

Salivary glands: Submandibular and parotid glands are normal
bilaterally.

Thyroid: Normal

Lymph nodes: No significant upper cervical adenopathy is present. A
heterogeneous left supraclavicular mass measures 6.1 x 2.9 x 2.5 cm.
A smaller more homogeneous lesion was present on the prior exam.

Vascular: Within normal limits

Limited intracranial: Unremarkable

Visualized orbits: Within normal limits. No optic nerve mass is
present.

Mastoids and visualized paranasal sinuses: A polyp or mucous
retention cyst is present inferiorly within the left maxillary
sinus.

Skeleton: There is some reversal of the normal cervical lordosis.
Vertebral body heights are maintained. Alignment is anatomic.
Sphenoid bone is normal bilaterally. Mandible is intact and located.

Upper chest: The lung apices are clear.

Other: Nerve roots appear to be enlarged bilaterally.
IMPRESSION: 1. Heterogeneous mass lesion in the left supraclavicular station
measuring 6.1 x 2.9 x 2.5 cm. Patient appears to have enlarged
cervical nerve roots and may have NF 1. A cystic lesion was present
previously. This may represent a degenerated schwannoma or plexiform
neurofibroma. Lymphangioma is considered less likely. Although the
patient has a distant history of rhabdomyosarcoma, metastatic
disease is considered unlikely. Recommend MRI of the neck without
and with contrast for further evaluation prior to biopsy.
2. No other significant cervical adenopathy or other primary lesion.

## 2023-05-29 ENCOUNTER — Encounter (HOSPITAL_BASED_OUTPATIENT_CLINIC_OR_DEPARTMENT_OTHER): Payer: Self-pay

## 2023-05-29 ENCOUNTER — Other Ambulatory Visit: Payer: Self-pay

## 2023-05-29 ENCOUNTER — Emergency Department (HOSPITAL_BASED_OUTPATIENT_CLINIC_OR_DEPARTMENT_OTHER)
Admission: EM | Admit: 2023-05-29 | Discharge: 2023-05-29 | Disposition: A | Discharge status: Home / Self Care | Payer: Medicaid Other

## 2023-05-29 DIAGNOSIS — R519 Headache, unspecified: Secondary | ICD-10-CM | POA: Diagnosis not present

## 2023-05-29 DIAGNOSIS — R42 Dizziness and giddiness: Secondary | ICD-10-CM | POA: Diagnosis present

## 2023-05-29 DIAGNOSIS — R5383 Other fatigue: Secondary | ICD-10-CM | POA: Insufficient documentation

## 2023-05-29 DIAGNOSIS — Z20822 Contact with and (suspected) exposure to covid-19: Secondary | ICD-10-CM | POA: Diagnosis not present

## 2023-05-29 LAB — RESP PANEL BY RT-PCR (RSV, FLU A&B, COVID)  RVPGX2
Influenza A by PCR: NEGATIVE
Influenza B by PCR: NEGATIVE
Resp Syncytial Virus by PCR: NEGATIVE
SARS Coronavirus 2 by RT PCR: NEGATIVE

## 2023-05-29 MED ORDER — IBUPROFEN 800 MG PO TABS
800.0000 mg | ORAL_TABLET | Freq: Once | ORAL | Status: AC
  Administered 2023-05-29: 800 mg via ORAL
  Filled 2023-05-29: qty 1

## 2023-05-29 NOTE — Discharge Instructions (Signed)
You have been seen today for your complaint of fatigue, headache, feeling off. Your lab work was negative for flu, COVID, RSV. Your discharge medications include Alternate tylenol and ibuprofen for pain. You may alternate these every 4 hours. You may take up to 800 mg of ibuprofen at a time and up to 1000 mg of tylenol. Home care instructions are as follows:  Drink plenty of water Follow up with: Your primary care provider Please seek immediate medical care if you develop any of the following symptoms: You feel confused, feel like you might faint, or faint. Your vision is blurry or you have a severe headache. You have severe pain in your abdomen, your back, or the area between your waist and hips (pelvis). You have chest pain, shortness of breath, or an irregular or fast heartbeat. You are unable to urinate, or you urinate less than normal. You have abnormal bleeding from the rectum, nose, lungs, nipples, or, if you are male, the vagina. You vomit blood. You have thoughts about hurting yourself or others. At this time there does not appear to be the presence of an emergent medical condition, however there is always the potential for conditions to change. Please read and follow the below instructions.  Do not take your medicine if  develop an itchy rash, swelling in your mouth or lips, or difficulty breathing; call 911 and seek immediate emergency medical attention if this occurs.  You may review your lab tests and imaging results in their entirety on your MyChart account.  Please discuss all results of fully with your primary care provider and other specialist at your follow-up visit.  Note: Portions of this text may have been transcribed using voice recognition software. Every effort was made to ensure accuracy; however, inadvertent computerized transcription errors may still be present.

## 2023-05-29 NOTE — ED Triage Notes (Signed)
Reports dizziness, fatigue, runny nose, "feeling hot" since last night.

## 2023-05-29 NOTE — ED Provider Notes (Signed)
[place] [facility] AT [facility] Provider Note   CSN: [id number] Arrival date & time: 05/29/23  9147     History  Chief Complaint  Patient presents with   Dizziness    Jailen Coward is a 38 y.o. male.  With history of rhabdosarcoma, schwannoma of the left brachial plexus presenting to the ED for evaluation of feeling "off."  States the symptoms began yesterday.  He is wondering if this is due to lack of coffee as he typically drinks multiple cups per day but stopped drinking coffee altogether 2 days ago.  He states he feels dehydrated and has had some mild intermittent headaches.  He denies nausea, vomiting, abdominal pain, chest pain, shortness of breath, cough, congestion, rhinorrhea, sick contacts.  He denies fevers or chills.  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  States he occasionally smokes tobacco.  No significant alcohol use or recreational drug use.  HPI     Home Medications Prior to Admission medications   Medication Sig Start Date End Date Taking? Authorizing Provider  HYDROcodone-acetaminophen (NORCO) 5-325 MG tablet Take 1 tablet by mouth every 6 (six) hours as needed (for pain). 02/13/19   Molpus, John, MD  ondansetron (ZOFRAN ODT) 8 MG disintegrating tablet Take 1 tablet (8 mg total) by mouth every 8 (eight) hours as needed for nausea or vomiting. 02/13/19   Molpus, Jonny Ruiz, MD      Allergies    Ativan [lorazepam]    Review of Systems   Review of Systems  Neurological:  Positive for headaches.  All other systems reviewed and are negative.   Physical Exam Updated Vital Signs BP 139/87   Pulse (!) 102   Temp 97.9 F (36.6 C) (Oral)   Resp 18   Ht 5\' 9"  (1.753 m)   Wt 59 kg   SpO2 100%   BMI 19.20 kg/m  Physical Exam Vitals and nursing note reviewed.  Constitutional:      General: He is not in acute distress.    Appearance: Normal appearance. He is  normal weight. He is not ill-appearing.     Comments: Resting comfortably in bed  HENT:     Head: Normocephalic and atraumatic.     Right Ear: Tympanic membrane and ear canal normal.     Left Ear: Tympanic membrane and ear canal normal.  Cardiovascular:     Rate and Rhythm: Normal rate and regular rhythm.  Pulmonary:     Effort: Pulmonary effort is normal. No respiratory distress.     Breath sounds: No wheezing, rhonchi or rales.  Abdominal:     General: Abdomen is flat.     Tenderness: There is no abdominal tenderness. There is no guarding.  Musculoskeletal:        General: Normal range of motion.     Cervical back: Neck supple.  Skin:    General: Skin is warm and dry.  Neurological:     Mental Status: He is alert and oriented to person, place, and time.  Psychiatric:        Mood and Affect: Mood normal.        Behavior: Behavior normal.     ED Results / Procedures / Treatments   Labs (all labs ordered are listed, but only abnormal results are displayed) Labs Reviewed  RESP PANEL BY RT-PCR (RSV, FLU A&B, COVID)  [id number]    EKG None  Radiology No  results found.  Procedures Procedures    Medications Ordered in ED Medications  ibuprofen (ADVIL) tablet 800 mg (800 mg Oral Given 05/29/23 0936)    ED Course/ Medical Decision Making/ A&P                                 Medical Decision Making This patient presents to the ED for concern of fatigue, this involves an extensive number of treatment options, and is a complaint that carries with it a high risk of complications and morbidity.  The differential diagnosis of weakness includes but is not limited to neurologic causes (GBS, myasthenia gravis, CVA, MS, ALS, transverse myelitis, spinal cord injury, CVA, botulism, ) and other causes: ACS, Arrhythmia, syncope, orthostatic hypotension, sepsis, hypoglycemia, electrolyte disturbance, hypothyroidism, respiratory failure, symptomatic anemia, dehydration, heat injury,  polypharmacy, malignancy.   My initial workup includes respiratory panel, pain control  Additional history obtained from: Nursing notes from this visit.  I ordered, reviewed and interpreted labs which include: Respiratory panel.  Negative.  Afebrile, hemodynamically stable.  38 year old male presenting to the ED for evaluation of mild headache and feeling off.  Symptoms really started yesterday.  He states he recently quit drinking caffeine cold [place] after numerous years of drinking this daily.  He denies other complaints.  He appears very well on physical exam.  Respiratory panel negative.  I had a shared decision-making conversation with the patient regarding further testing.  He declines blood work or imaging.  Believe this is reasonable as his symptoms are mild.  He was given Motrin and reports improvement in his symptoms.  He was encouraged to follow-up with his primary care provider for continued management.  He was given return precautions.  Stable at discharge.  At this time there does not appear to be any evidence of an acute emergency medical condition and the patient appears stable for discharge with appropriate outpatient follow up. Diagnosis was discussed with patient who verbalizes understanding of care plan and is agreeable to discharge. I have discussed return precautions with patient who verbalizes understanding. Patient encouraged to follow-up with their PCP within 1 week. All questions answered.  Note: Portions of this report may have been transcribed using voice recognition software. Every effort was made to ensure accuracy; however, inadvertent computerized transcription errors may still be present.        Final Clinical Impression(s) / ED Diagnoses Final diagnoses:  Other fatigue    Rx / DC Orders ED Discharge Orders     None         Michelle Piper, PA-C 05/29/23 1012    Coral Spikes, DO 05/29/23 1412

## 2023-09-03 ENCOUNTER — Emergency Department (HOSPITAL_BASED_OUTPATIENT_CLINIC_OR_DEPARTMENT_OTHER)

## 2023-09-03 ENCOUNTER — Other Ambulatory Visit: Payer: Self-pay

## 2023-09-03 ENCOUNTER — Emergency Department (HOSPITAL_BASED_OUTPATIENT_CLINIC_OR_DEPARTMENT_OTHER)
Admission: EM | Admit: 2023-09-03 | Discharge: 2023-09-03 | Disposition: A | Discharge status: Home / Self Care | Attending: Emergency Medicine | Admitting: Emergency Medicine

## 2023-09-03 ENCOUNTER — Encounter (HOSPITAL_BASED_OUTPATIENT_CLINIC_OR_DEPARTMENT_OTHER): Payer: Self-pay

## 2023-09-03 DIAGNOSIS — R509 Fever, unspecified: Secondary | ICD-10-CM | POA: Insufficient documentation

## 2023-09-03 DIAGNOSIS — M791 Myalgia, unspecified site: Secondary | ICD-10-CM | POA: Insufficient documentation

## 2023-09-03 DIAGNOSIS — R519 Headache, unspecified: Secondary | ICD-10-CM | POA: Diagnosis present

## 2023-09-03 DIAGNOSIS — Z85828 Personal history of other malignant neoplasm of skin: Secondary | ICD-10-CM | POA: Diagnosis not present

## 2023-09-03 DIAGNOSIS — R5383 Other fatigue: Secondary | ICD-10-CM | POA: Insufficient documentation

## 2023-09-03 DIAGNOSIS — E871 Hypo-osmolality and hyponatremia: Secondary | ICD-10-CM | POA: Insufficient documentation

## 2023-09-03 LAB — CBC WITH DIFFERENTIAL/PLATELET
Abs Immature Granulocytes: 0.01 10*3/uL (ref 0.00–0.07)
Basophils Absolute: 0 10*3/uL (ref 0.0–0.1)
Basophils Relative: 1 %
Eosinophils Absolute: 0 10*3/uL (ref 0.0–0.5)
Eosinophils Relative: 0 %
HCT: 36.4 % — ABNORMAL LOW (ref 39.0–52.0)
Hemoglobin: 12.5 g/dL — ABNORMAL LOW (ref 13.0–17.0)
Immature Granulocytes: 0 %
Lymphocytes Relative: 24 %
Lymphs Abs: 1.6 10*3/uL (ref 0.7–4.0)
MCH: 28.9 pg (ref 26.0–34.0)
MCHC: 34.3 g/dL (ref 30.0–36.0)
MCV: 84.3 fL (ref 80.0–100.0)
Monocytes Absolute: 1.1 10*3/uL — ABNORMAL HIGH (ref 0.1–1.0)
Monocytes Relative: 17 %
Neutro Abs: 3.8 10*3/uL (ref 1.7–7.7)
Neutrophils Relative %: 58 %
Platelets: 191 10*3/uL (ref 150–400)
RBC: 4.32 MIL/uL (ref 4.22–5.81)
RDW: 13.1 % (ref 11.5–15.5)
WBC: 6.5 10*3/uL (ref 4.0–10.5)
nRBC: 0 % (ref 0.0–0.2)

## 2023-09-03 LAB — URINALYSIS, ROUTINE W REFLEX MICROSCOPIC
Bilirubin Urine: NEGATIVE
Glucose, UA: 100 mg/dL — AB
Ketones, ur: NEGATIVE mg/dL
Leukocytes,Ua: NEGATIVE
Nitrite: NEGATIVE
Protein, ur: 100 mg/dL — AB
Specific Gravity, Urine: 1.02 (ref 1.005–1.030)
pH: 6.5 (ref 5.0–8.0)

## 2023-09-03 LAB — MENINGITIS/ENCEPHALITIS PANEL (CSF)

## 2023-09-03 LAB — CSF CELL COUNT WITH DIFFERENTIAL
Eosinophils, CSF: 0 % (ref 0–1)
Eosinophils, CSF: 0 % (ref 0–1)
Lymphs, CSF: 93 % — ABNORMAL HIGH (ref 40–80)
Lymphs, CSF: 94 % — ABNORMAL HIGH (ref 40–80)
Monocyte-Macrophage-Spinal Fluid: 4 % — ABNORMAL LOW (ref 15–45)
Monocyte-Macrophage-Spinal Fluid: 3 % — ABNORMAL LOW (ref 15–45)
Other Cells, CSF: 2
Other Cells, CSF: 3
RBC Count, CSF: 785 /mm3 — ABNORMAL HIGH
RBC Count, CSF: 0 /mm3
Segmented Neutrophils-CSF: 1 % (ref 0–6)
Segmented Neutrophils-CSF: 0 % (ref 0–6)
Tube #: 1
Tube #: 4
WBC, CSF: 231 /mm3 (ref 0–5)
WBC, CSF: 167 /mm3 (ref 0–5)

## 2023-09-03 LAB — RESP PANEL BY RT-PCR (RSV, FLU A&B, COVID)  RVPGX2
Influenza A by PCR: NEGATIVE
Influenza B by PCR: NEGATIVE
Resp Syncytial Virus by PCR: NEGATIVE
SARS Coronavirus 2 by RT PCR: NEGATIVE

## 2023-09-03 LAB — COMPREHENSIVE METABOLIC PANEL WITH GFR
ALT: 14 U/L (ref 0–44)
AST: 20 U/L (ref 15–41)
Albumin: 4.4 g/dL (ref 3.5–5.0)
Alkaline Phosphatase: 78 U/L (ref 38–126)
Anion gap: 14 (ref 5–15)
BUN: 17 mg/dL (ref 6–20)
CO2: 21 mmol/L — ABNORMAL LOW (ref 22–32)
Calcium: 9.3 mg/dL (ref 8.9–10.3)
Chloride: 95 mmol/L — ABNORMAL LOW (ref 98–111)
Creatinine, Ser: 1.57 mg/dL — ABNORMAL HIGH (ref 0.61–1.24)
GFR, Estimated: 57 mL/min — ABNORMAL LOW (ref >60)
Glucose, Bld: 114 mg/dL — ABNORMAL HIGH (ref 70–99)
Potassium: 4 mmol/L (ref 3.5–5.1)
Sodium: 130 mmol/L — ABNORMAL LOW (ref 135–145)
Total Bilirubin: 0.3 mg/dL (ref 0.0–1.2)
Total Protein: 7.9 g/dL (ref 6.5–8.1)

## 2023-09-03 LAB — CRYPTOCOCCAL ANTIGEN, CSF: Crypto Ag: NEGATIVE

## 2023-09-03 LAB — URINALYSIS, MICROSCOPIC (REFLEX)

## 2023-09-03 LAB — PROTEIN AND GLUCOSE, CSF
Glucose, CSF: 67 mg/dL (ref 40–70)
Total  Protein, CSF: 30 mg/dL (ref 15–45)

## 2023-09-03 LAB — HIV ANTIBODY (ROUTINE TESTING W REFLEX): HIV Screen 4th Generation wRfx: NONREACTIVE

## 2023-09-03 MED ORDER — VANCOMYCIN HCL IN DEXTROSE 1-5 GM/200ML-% IV SOLN
1000.0000 mg | Freq: Once | INTRAVENOUS | Status: AC
  Administered 2023-09-03: 1000 mg via INTRAVENOUS
  Filled 2023-09-03: qty 200

## 2023-09-03 MED ORDER — ACETAMINOPHEN 325 MG PO TABS
650.0000 mg | ORAL_TABLET | Freq: Once | ORAL | Status: AC
  Administered 2023-09-03: 650 mg via ORAL
  Filled 2023-09-03: qty 2

## 2023-09-03 MED ORDER — DEXAMETHASONE SODIUM PHOSPHATE 10 MG/ML IJ SOLN
10.0000 mg | Freq: Once | INTRAMUSCULAR | Status: AC
  Administered 2023-09-03: 10 mg via INTRAVENOUS
  Filled 2023-09-03: qty 1

## 2023-09-03 MED ORDER — DIPHENHYDRAMINE HCL 50 MG/ML IJ SOLN
12.5000 mg | Freq: Once | INTRAMUSCULAR | Status: AC
  Administered 2023-09-03: 12.5 mg via INTRAVENOUS
  Filled 2023-09-03: qty 1

## 2023-09-03 MED ORDER — SODIUM CHLORIDE 0.9 % IV BOLUS
1000.0000 mL | Freq: Once | INTRAVENOUS | Status: AC
  Administered 2023-09-03: 1000 mL via INTRAVENOUS

## 2023-09-03 MED ORDER — METHOCARBAMOL 500 MG PO TABS: 500.0000 mg | ORAL_TABLET | Freq: Three times a day (TID) | ORAL | 0 refills | Status: AC | PRN

## 2023-09-03 MED ORDER — METOCLOPRAMIDE HCL 5 MG/ML IJ SOLN
10.0000 mg | Freq: Once | INTRAMUSCULAR | Status: AC
  Administered 2023-09-03: 10 mg via INTRAVENOUS
  Filled 2023-09-03: qty 2

## 2023-09-03 MED ORDER — SODIUM CHLORIDE 0.9 % IV SOLN
2.0000 g | Freq: Once | INTRAVENOUS | Status: AC
  Administered 2023-09-03: 2 g via INTRAVENOUS
  Filled 2023-09-03: qty 20

## 2023-09-03 MED ORDER — LIDOCAINE HCL 2 % IJ SOLN
10.0000 mL | Freq: Once | INTRAMUSCULAR | Status: AC
  Administered 2023-09-03: 200 mg
  Filled 2023-09-03: qty 20

## 2023-09-03 NOTE — ED Provider Notes (Signed)
 Blood pressure 138/70, pulse 71, temperature 99.8 F (37.7 C), temperature source Oral, resp. rate 15, weight 59.9 kg, SpO2 99%.  Assuming care from Dr. Alison Irvine.  In short, Jonathan Lucero is a 38 y.o. male with a chief complaint of Headache .  Refer to the original H&P for additional details.  The current plan of care is to follow up LP labs.  11:35 AM  Meningitis panel resulting showing no detected pathogen on the panel.  Cryptococcal antigen negative.  Protein and glucose normal.  Cell count shows minimally elevated WBCs at 231 with associated lymphocytes.  Gram stain shows no organisms. Favor viral process. Plan for work note, supportive care meds, and strict ED return precautions.    Jonathan Ching, MD 09/03/23 [phone]

## 2023-09-03 NOTE — Discharge Instructions (Signed)
 You were seen in the emerged from today with headache and fever.  Your workup here is reassuring and not showing evidence of a bacterial meningitis.  I suspect that you have a viral infection causing your symptoms.  You may continue to alternate Tylenol  and ibuprofen  as needed for pain.  I have called in a muscle relaxer, Robaxin, which you can take for back tightness and soreness.  It can make you drowsy and you should not drive a car while taking it or take it with alcohol.  You had been laying flat for some time after your lumbar puncture.  Try to continue resting today.  Unfortunately headache can be a complication of lumbar puncture.   Return to the emergency department if you develop sudden worsening headache, confusion, vomiting, rash.

## 2023-09-03 NOTE — ED Triage Notes (Signed)
 POV/ presents with HA/ fever x 2days/ took ibuprofen  PTA/ A&Ox4/ ambulatory

## 2023-09-03 NOTE — ED Notes (Signed)
 Pt states he does not want to do lumbar puncture

## 2023-09-03 NOTE — ED Provider Notes (Signed)
 [facility] Provider Note   CSN: [id number] Arrival date & time: 09/03/23  0330     History  Chief Complaint  Patient presents with   Headache    Jonathan Lucero is a 38 y.o. male.  Patient reports headache and fever for the past 2 days without associated symptoms.  Symptoms started the evening of May 25 and progressed to the 26.  Reports severe diffuse headache that was gradual in onset that waxes and wanes in severity.  Fever at home up to 102.  Using Tylenol  Motrin  without relief.  No history of migraines.  Denies any other symptoms such as congestion, cough, runny nose, sore throat, nausea, vomiting, pain with urination or blood in the urine.  No travel or sick contacts.  No photophobia or phonophobia.  No nausea or vomiting.  No neck pain or stiffness.  Describes body aches all over however.  Reports he had skin cancer as a child but is cancer free now.  He did have a neck mass that was removed several years ago and was told it was benign. He is not currently chemotherapy and not immunosuppressed.  The history is provided by the patient.  Headache Associated symptoms: fatigue, fever and myalgias   Associated symptoms: no abdominal pain, no congestion, no cough, no nausea, no sore throat, no vomiting and no weakness        Home Medications Prior to Admission medications   Medication Sig Start Date End Date Taking? Authorizing Provider  HYDROcodone -acetaminophen  (NORCO) 5-325 MG tablet Take 1 tablet by mouth every 6 (six) hours as needed (for pain). 02/13/19   Molpus, John, MD  ondansetron  (ZOFRAN  ODT) 8 MG disintegrating tablet Take 1 tablet (8 mg total) by mouth every 8 (eight) hours as needed for nausea or vomiting. 02/13/19   Molpus, Autry Legions, MD      Allergies    Ativan [lorazepam]    Review of Systems   Review of Systems  Constitutional:  Positive for fatigue and fever. Negative for activity change and appetite change.  HENT:   Negative for congestion and sore throat.   Respiratory:  Negative for cough, chest tightness and shortness of breath.   Gastrointestinal:  Negative for abdominal pain, nausea and vomiting.  Genitourinary:  Negative for dysuria and hematuria.  Musculoskeletal:  Positive for arthralgias and myalgias.  Skin:  Negative for wound.  Neurological:  Positive for headaches. Negative for weakness.   all other systems are negative except as noted in the HPI and PMH.    Physical Exam Updated Vital Signs BP (!) 152/74 (BP Location: Right Arm)   Pulse 80   Temp (!) 100.4 F (38 C) (Oral)   Resp 17   Wt 59.9 kg   SpO2 100%   BMI 19.49 kg/m  Physical Exam Vitals and nursing note reviewed.  Constitutional:      General: He is not in acute distress.    Appearance: He is well-developed.  HENT:     Head: Normocephalic and atraumatic.     Right Ear: Tympanic membrane normal.     Left Ear: Tympanic membrane normal.     Mouth/Throat:     Mouth: Mucous membranes are moist.     Pharynx: No oropharyngeal exudate.  Eyes:     Conjunctiva/sclera: Conjunctivae normal.     Pupils: Pupils are equal, round, and reactive to light.  Neck:     Comments: No meningismus. Able to touch chin to chest without difficulty and  range neck in all directions without pain Cardiovascular:     Rate and Rhythm: Normal rate and regular rhythm.     Heart sounds: Normal heart sounds. No murmur heard. Pulmonary:     Effort: Pulmonary effort is normal. No respiratory distress.     Breath sounds: Normal breath sounds.  Abdominal:     Palpations: Abdomen is soft.     Tenderness: There is no abdominal tenderness. There is no guarding or rebound.  Musculoskeletal:        General: No tenderness. Normal range of motion.     Cervical back: Normal range of motion and neck supple.     Comments: Soft tissue asymmetry to right sided paraspinal lumbar spine.  Patient states this is chronic after his radiation treatment  Skin:     General: Skin is warm.  Neurological:     Mental Status: He is alert and oriented to person, place, and time.     Cranial Nerves: No cranial nerve deficit.     Motor: No abnormal muscle tone.     Coordination: Coordination normal.     Comments: No ataxia on finger to nose bilaterally. No pronator drift. 5/5 strength throughout. CN 2-12 intact.Equal grip strength. Sensation intact.   Psychiatric:        Behavior: Behavior normal.     ED Results / Procedures / Treatments   Labs (all labs ordered are listed, but only abnormal results are displayed) Labs Reviewed  COMPREHENSIVE METABOLIC PANEL WITH GFR - Abnormal; Notable for the following components:      Result Value   Sodium 130 (*)    Chloride 95 (*)    CO2 21 (*)    Glucose, Bld 114 (*)    Creatinine, Ser 1.57 (*)    GFR, Estimated 57 (*)    All other components within normal limits  CBC WITH DIFFERENTIAL/PLATELET - Abnormal; Notable for the following components:   Hemoglobin 12.5 (*)    HCT 36.4 (*)    Monocytes Absolute 1.1 (*)    All other components within normal limits  URINALYSIS, ROUTINE W REFLEX MICROSCOPIC - Abnormal; Notable for the following components:   Glucose, UA 100 (*)    Hgb urine dipstick TRACE (*)    Protein, ur 100 (*)    All other components within normal limits  URINALYSIS, MICROSCOPIC (REFLEX) - Abnormal; Notable for the following components:   Bacteria, UA RARE (*)    All other components within normal limits  RESP PANEL BY RT-PCR (RSV, FLU A&B, COVID)  RVPGX2  CSF CULTURE W GRAM STAIN  CULTURE, BLOOD (ROUTINE X 2)  CULTURE, BLOOD (ROUTINE X 2)  PROTEIN AND GLUCOSE, CSF  CSF CELL COUNT WITH DIFFERENTIAL  MENINGITIS/ENCEPHALITIS PANEL (CSF)  CRYPTOCOCCAL ANTIGEN, CSF  CSF CELL COUNT WITH DIFFERENTIAL  HIV ANTIBODY (ROUTINE TESTING W REFLEX)    EKG None  Radiology CT Head Wo Contrast Result Date: 09/03/2023 CLINICAL DATA:  38 year old male with headache and fever for 2 days. EXAM: CT HEAD  WITHOUT CONTRAST TECHNIQUE: Contiguous axial images were obtained from the base of the skull through the vertex without intravenous contrast. RADIATION DOSE REDUCTION: This exam was performed according to the departmental dose-optimization program which includes automated exposure control, adjustment of the mA and/or kV according to patient size and/or use of iterative reconstruction technique. COMPARISON:  Chest CTA 02/13/2019. FINDINGS: Brain: Normal cerebral volume. No midline shift, ventriculomegaly, mass effect, evidence of mass lesion, intracranial hemorrhage or evidence of cortically based acute infarction. Gray-white  matter differentiation is within normal limits throughout the brain. Normal basilar cisterns. Vascular: No suspicious intracranial vascular hyperdensity. Skull: Heterogeneous lucency along the roof of the right orbit (coronal image 55), and similar heterogeneity of bone mineralization along the left lateral orbit wall, left frontal bone just above the left orbit. Orbits soft tissues appear to remain symmetric and normal, no intraorbital mass or inflammation is evident and elsewhere the calvarium appears intact. Mild heterogeneous bone mineralization also at the skull vertex. But no definite destructive osseous lesion. Sinuses/Orbits: Tympanic cavities, visualized paranasal sinuses and mastoids are well aerated. Other: Disconjugate gaze, nonspecific. Otherwise negative orbit and scalp soft tissues. IMPRESSION: 1. Normal noncontrast CT appearance of the Brain. 2. Heterogeneous and nonspecific appearance of bone along the orbit roofs, left frontal bone and vertex. Similar mixed lytic and sclerotic bone findings reported on 2020 Chest CTA along with right nephrectomy. Although nonspecific, benign etiology is favored in light of the 2020 findings, perhaps metabolic/renal osteodystrophy related. Electronically Signed   By: Marlise Simpers M.D.   On: 09/03/2023 05:10    Procedures Lumbar  Puncture  Date/Time: 09/03/2023 5:58 AM  Performed by: Earma Gloss, MD Authorized by: Earma Gloss, MD   Consent:    Consent obtained:  Written   Consent given by:  Patient   Risks, benefits, and alternatives were discussed: yes     Risks discussed:  Headache, nerve damage, infection, pain, repeat procedure and bleeding   Alternatives discussed:  No treatment Universal protocol:    Procedure explained and questions answered to patient or proxy's satisfaction: yes     Relevant documents present and verified: yes     Test results available: yes     Imaging studies available: yes     Immediately prior to procedure a time out was called: yes     Site/side marked: yes     Patient identity confirmed:  Verbally with patient Pre-procedure details:    Procedure purpose:  Diagnostic   Preparation: Patient was prepped and draped in usual sterile fashion   Anesthesia:    Anesthesia method:  Local infiltration   Local anesthetic:  Lidocaine 1% w/o epi Procedure details:    Lumbar space:  L4-L5 interspace   Patient position:  Sitting   Needle gauge:  20   Needle type:  Spinal needle - Quincke tip   Needle length (in):  2.5   Number of attempts:  2   Fluid appearance:  Blood-tinged then clearing   Tubes of fluid:  4   Total volume (ml):  8 Post-procedure details:    Puncture site:  Adhesive bandage applied   Procedure completion:  Tolerated well, no immediate complications .Critical Care  Performed by: Earma Gloss, MD Authorized by: Earma Gloss, MD   Critical care provider statement:    Critical care time (minutes):  35   Critical care time was exclusive of:  Separately billable procedures and treating other patients   Critical care was necessary to treat or prevent imminent or life-threatening deterioration of the following conditions: meningitis rule out.   Critical care was time spent personally by me on the following activities:  Development of treatment plan  with patient or surrogate, discussions with consultants, evaluation of patient's response to treatment, examination of patient, ordering and review of laboratory studies, ordering and review of radiographic studies, ordering and performing treatments and interventions, pulse oximetry, re-evaluation of patient's condition, review of old charts, blood draw for specimens and obtaining history from patient or surrogate   I  assumed direction of critical care for this patient from another provider in my specialty: no     Care discussed with: admitting provider       Medications Ordered in ED Medications  metoCLOPramide (REGLAN) injection 10 mg (has no administration in time range)  diphenhydrAMINE (BENADRYL) injection 12.5 mg (has no administration in time range)  acetaminophen  (TYLENOL ) tablet 650 mg (650 mg Oral Given 09/03/23 0342)    ED Course/ Medical Decision Making/ A&P                                 Medical Decision Making Amount and/or Complexity of Data Reviewed Labs: ordered. Decision-making details documented in ED Course. Radiology: ordered and independent interpretation performed. Decision-making details documented in ED Course. ECG/medicine tests: ordered and independent interpretation performed. Decision-making details documented in ED Course.  Risk OTC drugs. Prescription drug management.   Headache and fever without associated symptoms.  Neurological exam is nonfocal.  He is febrile on arrival but in no distress.  No meningismus on exam.  No photophobia.  Presentation concerning as he has a headache and fever without other associated symptoms.  Does have myalgias. Discussed possibility of viral meningitis.  Bacterial meningitis seems less likely at this time given his nontoxic appearance.  Patient given IV fluids, Toradol, Reglan and Benadryl.  COVID and flu swabs are negative. Labs show no significant leukocytosis. Mild hyponatremia of 130.  CT head negative for  hemorrhage or mass lesion.  Risks and benefits of lumbar puncture discussed with patient at bedside.  Discussed if he has meningitis is likely viral and not bacterial.  He is agreeable to lumbar puncture after discussion of risks and benefits.  Lumbar puncture performed as above.  Patient tolerated well.  Empiric vancomycin and Rocephin given while CSF studies are pending.  Remains afebrile and nontoxic.  Resting comfortably on recheck.  Discussed with patient that given his CT abnormalities from today and from previous years it is important he establish care with a primary doctor for further evaluation of his sclerotic bone lesions.  If CSF studies are reassuring, patient can likely be discharged with supportive care for likely viral febrile illness. Dr. Dolan Freiberg to assume care at shift change        Final Clinical Impression(s) / ED Diagnoses Final diagnoses:  Bad headache  Febrile illness    Rx / DC Orders ED Discharge Orders     None         Eda Magnussen, Mara [place], MD 09/03/23 [phone]

## 2023-09-04 LAB — PATHOLOGIST SMEAR REVIEW

## 2023-09-05 ENCOUNTER — Encounter: Payer: Self-pay | Admitting: Family Medicine

## 2023-09-05 ENCOUNTER — Ambulatory Visit: Admitting: Family Medicine

## 2023-09-05 VITALS — BP 143/82 | HR 94 | Ht 68.5 in | Wt 105.0 lb

## 2023-09-05 DIAGNOSIS — Z0000 Encounter for general adult medical examination without abnormal findings: Secondary | ICD-10-CM

## 2023-09-05 DIAGNOSIS — R809 Proteinuria, unspecified: Secondary | ICD-10-CM

## 2023-09-05 DIAGNOSIS — R81 Glycosuria: Secondary | ICD-10-CM | POA: Diagnosis not present

## 2023-09-05 DIAGNOSIS — R7989 Other specified abnormal findings of blood chemistry: Secondary | ICD-10-CM | POA: Diagnosis not present

## 2023-09-05 DIAGNOSIS — R519 Headache, unspecified: Secondary | ICD-10-CM

## 2023-09-05 MED ORDER — PREDNISONE 20 MG PO TABS: 20.0000 mg | ORAL_TABLET | Freq: Every day | ORAL | 0 refills | Status: AC

## 2023-09-05 NOTE — Progress Notes (Signed)
 New Patient Office Visit  Subjective    Patient ID: Jonathan Lucero, male    DOB: 1986/02/10  Age: 38 y.o. MRN: [id number]  CC:  Chief Complaint  Patient presents with   Establish Care    HPI Del Overfelt presents to establish care. He lives with his 65 year old son. He works as a Financial risk analyst.  Discussed the use of AI scribe software for clinical note transcription with the patient, who gave verbal consent to proceed.  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  He has been experiencing intermittent fever and headache since Sunday, which led to an emergency department visit on Tuesday. The fever initially measured 102F. The headache began as 'floating' and is now primarily frontal, with an intensity of 8 out of 10. No associated symptoms such as cough, sneezing, runny nose, nausea, vomiting, diarrhea, constipation, or rashes. No recent illness exposure.  A lumbar puncture was performed, and the meningitis panel was negative. His white blood cell count was slightly elevated, and electrolyte imbalances were noted, including low sodium. He has been primarily consuming fluids with a reduced appetite. Cultures were negative, and a CT scan of the head was normal, though it noted bone changes consistent with previous findings, likely benign.  Per ED note "Meningitis panel resulting showing no detected pathogen on the panel.  Cryptococcal antigen negative.  Protein and glucose normal.  Cell count shows minimally elevated WBCs at 231 with associated lymphocytes.  Gram stain shows no organisms. 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 He also has scoliosis and experiences back pain with heavy lifting. He is currently taking Robaxin  for back  stiffness post-procedure but reports no significant relief.  He lives with his 63 year old son and works as a Financial risk analyst. He denies current substance use, including drugs, alcohol, nicotine, and tobacco. He is not sexually active. He has an allergy to Ativan.  CT head wo contrast 09/03/2023: IMPRESSION: 1. Normal noncontrast CT appearance of the Brain.   2. Heterogeneous and nonspecific appearance of bone along the orbit roofs, left frontal bone and vertex. Similar mixed lytic and sclerotic bone findings reported on 2020 Chest CTA along with right nephrectomy. Although nonspecific, benign etiology is favored in light of the 2020 findings, perhaps metabolic/renal osteodystrophy related.          Outpatient Encounter Medications as of 09/05/2023  Medication Sig   methocarbamol  (ROBAXIN ) 500 MG tablet Take 1 tablet (500 mg total) by mouth every 8 (eight) hours as needed for muscle spasms.   predniSONE  (DELTASONE ) 20 MG tablet Take 1 tablet (20 mg total) by mouth daily with breakfast.   [DISCONTINUED] HYDROcodone -acetaminophen  (NORCO) 5-325 MG tablet Take 1 tablet by mouth every 6 (six) hours as needed (for pain).   [DISCONTINUED] ondansetron  (ZOFRAN  ODT) 8 MG disintegrating tablet Take 1 tablet (8 mg total) by mouth every 8 (eight) hours as needed for nausea or vomiting.   No facility-administered encounter medications on file as of 09/05/2023.    Past Medical History:  Diagnosis Date   Cancer [facility])    skin cancer as a child   Rhabdosarcoma [facility])     Past Surgical History:  Procedure Laterality Date   APPENDECTOMY     nephrectomy Right 1990   related to cancer/tumor  management    History reviewed. No pertinent family history.  Social History   Socioeconomic History   Marital status: Single    Spouse name: Not on file   Number of children: Not on file   Years of education: Not on file   Highest education level: Not on file  Occupational History   Not on file  Tobacco Use    Smoking status: Never   Smokeless tobacco: Never  Vaping Use   Vaping status: Never Used  Substance and Sexual Activity   Alcohol use: Not Currently   Drug use: Not Currently   Sexual activity: Not Currently  Other Topics Concern   Not on file  Social History Narrative   Not on file   Social Drivers of Health   Financial Resource Strain: Not on file  Food Insecurity: Not on file  Transportation Needs: Not on file  Physical Activity: Not on file  Stress: Not on file  Social Connections: Unknown (11/12/2022)   Received from [facility]   Social Network    Social Network: Not on file  Intimate Partner Violence: Unknown (11/12/2022)   Received from [facility]   HITS    Physically Hurt: Not on file    Insult or Talk Down To: Not on file    Threaten Physical Harm: Not on file    Scream or Curse: Not on file    ROS All review of systems negative except what is listed in the HPI      Objective    BP (!) 143/82   Pulse 94   Ht 5' 8.5" (1.74 m)   Wt 105 lb (47.6 kg)   SpO2 100%   BMI 15.73 kg/m   Physical Exam Vitals reviewed.  Constitutional:      Appearance: Normal appearance.  Cardiovascular:     Rate and Rhythm: Normal rate and regular rhythm.     Heart sounds: Normal heart sounds.  Pulmonary:     Effort: Pulmonary effort is normal.     Breath sounds: Normal breath sounds.  Musculoskeletal:     Comments: Scoliosis   Skin:    General: Skin is warm and dry.  Neurological:     Mental Status: He is alert and oriented to person, place, and time.  Psychiatric:        Mood and Affect: Mood normal.        Behavior: Behavior normal.        Thought Content: Thought content normal.        Judgment: Judgment normal.            Assessment & Plan:   Problem List Items Addressed This Visit   None Visit Diagnoses       Acute nonintractable headache, unspecified headache type    -  Primary   Relevant Medications   predniSONE (DELTASONE) 20 MG tablet    Other Relevant Orders   CBC with Differential/Platelet   Comprehensive metabolic panel with GFR     Encounter for medical examination to establish care         Proteinuria, unspecified type       Relevant Orders   Comprehensive metabolic panel with GFR   Urinalysis w microscopic + reflex cultur   Protein / creatinine ratio, urine     Glucosuria       Relevant Orders   Comprehensive metabolic panel with GFR   Hemoglobin A1c     Abnormal CBC measurement       Relevant  Orders   CBC with Differential/Platelet       Assessment & Plan Hospital follow-up / Headache Acute viral infection with frontal headache; intermittent fevers, last measured 2 days ago. Head CT scan unremarkable. Meningitis ruled out.  - Prescribe low-dose prednisone  for 5 days. Discussed prednisone  risks. - Monitor headache characteristics and fever. - Advise return if symptoms persist beyond a week or worsen. - Repeat blood work and urine analysis. - Advise hospital return if severe symptoms develop. - ED urine with glucose and protein - further evaluate today  Hyponatremia Hyponatremia likely due to dehydration from viral illness. Decreased oral intake reported. - Encourage adequate fluid intake. - Repeat blood work to monitor sodium levels.  Elevated BP Likely pain related. Supportive measures discussed. Monitor at home if able. Recheck in 1-2 weeks. Sooner if symptoms are persistent.       Return in about 2 weeks (around 09/19/2023) for BP follow-up.   Everlina Hock, NP

## 2023-09-06 ENCOUNTER — Ambulatory Visit: Payer: Self-pay | Admitting: Family Medicine

## 2023-09-06 DIAGNOSIS — Z681 Body mass index (BMI) 19 or less, adult: Secondary | ICD-10-CM

## 2023-09-06 DIAGNOSIS — R636 Underweight: Secondary | ICD-10-CM

## 2023-09-06 DIAGNOSIS — E871 Hypo-osmolality and hyponatremia: Secondary | ICD-10-CM

## 2023-09-06 DIAGNOSIS — N289 Disorder of kidney and ureter, unspecified: Secondary | ICD-10-CM

## 2023-09-06 DIAGNOSIS — R7989 Other specified abnormal findings of blood chemistry: Secondary | ICD-10-CM

## 2023-09-06 DIAGNOSIS — Z905 Acquired absence of kidney: Secondary | ICD-10-CM

## 2023-09-06 DIAGNOSIS — R779 Abnormality of plasma protein, unspecified: Secondary | ICD-10-CM

## 2023-09-06 DIAGNOSIS — R809 Proteinuria, unspecified: Secondary | ICD-10-CM

## 2023-09-06 LAB — CBC WITH DIFFERENTIAL/PLATELET
Basophils Absolute: 0 10*3/uL (ref 0.0–0.1)
Basophils Relative: 0.8 % (ref 0.0–3.0)
Eosinophils Absolute: 0 10*3/uL (ref 0.0–0.7)
Eosinophils Relative: 0 % (ref 0.0–5.0)
HCT: 41.2 % (ref 39.0–52.0)
Hemoglobin: 13.6 g/dL (ref 13.0–17.0)
Lymphocytes Relative: 23.8 % (ref 12.0–46.0)
Lymphs Abs: 1.4 10*3/uL (ref 0.7–4.0)
MCHC: 33.1 g/dL (ref 30.0–36.0)
MCV: 87.4 fl (ref 78.0–100.0)
Monocytes Absolute: 0.9 10*3/uL (ref 0.1–1.0)
Monocytes Relative: 15.6 % — ABNORMAL HIGH (ref 3.0–12.0)
Neutro Abs: 3.4 10*3/uL (ref 1.4–7.7)
Neutrophils Relative %: 59.8 % (ref 43.0–77.0)
Platelets: 191 10*3/uL (ref 150.0–400.0)
RBC: 4.71 Mil/uL (ref 4.22–5.81)
RDW: 13.1 % (ref 11.5–15.5)
WBC: 5.7 10*3/uL (ref 4.0–10.5)

## 2023-09-06 LAB — URINALYSIS W MICROSCOPIC + REFLEX CULTURE
Bacteria, UA: NONE SEEN /HPF
Bilirubin Urine: NEGATIVE
Hgb urine dipstick: NEGATIVE
Hyaline Cast: NONE SEEN /LPF
Leukocyte Esterase: NEGATIVE
Nitrites, Initial: NEGATIVE
RBC / HPF: NONE SEEN /HPF (ref 0–2)
Specific Gravity, Urine: 1.026 (ref 1.001–1.035)
Squamous Epithelial / HPF: NONE SEEN /HPF (ref <=5)
WBC, UA: NONE SEEN /HPF (ref 0–5)
pH: 7.5 (ref 5.0–8.0)

## 2023-09-06 LAB — PROTEIN / CREATININE RATIO, URINE
Creatinine, Urine: 213 mg/dL (ref 20–320)
Protein/Creat Ratio: 667 mg/g{creat} — ABNORMAL HIGH (ref 25–148)
Protein/Creatinine Ratio: 0.667 mg/mg{creat} — ABNORMAL HIGH (ref 0.025–0.148)
Total Protein, Urine: 142 mg/dL — ABNORMAL HIGH (ref 5–25)

## 2023-09-06 LAB — COMPREHENSIVE METABOLIC PANEL WITH GFR
ALT: 12 U/L (ref 0–53)
AST: 15 U/L (ref 0–37)
Albumin: 4.9 g/dL (ref 3.5–5.2)
Alkaline Phosphatase: 66 U/L (ref 39–117)
BUN: 18 mg/dL (ref 6–23)
CO2: 27 meq/L (ref 19–32)
Calcium: 9.9 mg/dL (ref 8.4–10.5)
Chloride: 92 meq/L — ABNORMAL LOW (ref 96–112)
Creatinine, Ser: 1.64 mg/dL — ABNORMAL HIGH (ref 0.40–1.50)
GFR: 52.19 mL/min — ABNORMAL LOW (ref >60.00)
Glucose, Bld: 104 mg/dL — ABNORMAL HIGH (ref 70–99)
Potassium: 4.6 meq/L (ref 3.5–5.1)
Sodium: 130 meq/L — ABNORMAL LOW (ref 135–145)
Total Bilirubin: 0.5 mg/dL (ref 0.2–1.2)
Total Protein: 8.6 g/dL — ABNORMAL HIGH (ref 6.0–8.3)

## 2023-09-06 LAB — CSF CULTURE W GRAM STAIN

## 2023-09-06 LAB — HEMOGLOBIN A1C: Hgb A1c MFr Bld: 6.2 % (ref 4.6–6.5)

## 2023-09-06 LAB — NO CULTURE INDICATED

## 2023-09-08 LAB — CULTURE, BLOOD (ROUTINE X 2)
Culture: NO GROWTH
Culture: NO GROWTH

## 2023-09-09 ENCOUNTER — Other Ambulatory Visit

## 2023-09-09 ENCOUNTER — Encounter (HOSPITAL_BASED_OUTPATIENT_CLINIC_OR_DEPARTMENT_OTHER): Payer: Self-pay

## 2023-09-09 ENCOUNTER — Other Ambulatory Visit: Payer: Self-pay

## 2023-09-09 ENCOUNTER — Emergency Department (HOSPITAL_BASED_OUTPATIENT_CLINIC_OR_DEPARTMENT_OTHER)
Admission: EM | Admit: 2023-09-09 | Discharge: 2023-09-09 | Disposition: A | Discharge status: Home / Self Care | Attending: Emergency Medicine | Admitting: Emergency Medicine

## 2023-09-09 DIAGNOSIS — M25511 Pain in right shoulder: Secondary | ICD-10-CM | POA: Insufficient documentation

## 2023-09-09 DIAGNOSIS — R519 Headache, unspecified: Secondary | ICD-10-CM | POA: Insufficient documentation

## 2023-09-09 DIAGNOSIS — M25512 Pain in left shoulder: Secondary | ICD-10-CM | POA: Insufficient documentation

## 2023-09-09 DIAGNOSIS — E871 Hypo-osmolality and hyponatremia: Secondary | ICD-10-CM | POA: Diagnosis not present

## 2023-09-09 DIAGNOSIS — M542 Cervicalgia: Secondary | ICD-10-CM | POA: Diagnosis present

## 2023-09-09 LAB — BASIC METABOLIC PANEL WITH GFR
Anion gap: 12 (ref 5–15)
BUN: 29 mg/dL — ABNORMAL HIGH (ref 6–20)
CO2: 24 mmol/L (ref 22–32)
Calcium: 9.1 mg/dL (ref 8.9–10.3)
Chloride: 93 mmol/L — ABNORMAL LOW (ref 98–111)
Creatinine, Ser: 1.58 mg/dL — ABNORMAL HIGH (ref 0.61–1.24)
GFR, Estimated: 56 mL/min — ABNORMAL LOW (ref >60)
Glucose, Bld: 102 mg/dL — ABNORMAL HIGH (ref 70–99)
Potassium: 4.6 mmol/L (ref 3.5–5.1)
Sodium: 129 mmol/L — ABNORMAL LOW (ref 135–145)

## 2023-09-09 LAB — CBC WITH DIFFERENTIAL/PLATELET
Abs Immature Granulocytes: 0.02 10*3/uL (ref 0.00–0.07)
Basophils Absolute: 0 10*3/uL (ref 0.0–0.1)
Basophils Relative: 0 %
Eosinophils Absolute: 0 10*3/uL (ref 0.0–0.5)
Eosinophils Relative: 0 %
HCT: 41 % (ref 39.0–52.0)
Hemoglobin: 14 g/dL (ref 13.0–17.0)
Immature Granulocytes: 0 %
Lymphocytes Relative: 20 %
Lymphs Abs: 1.2 10*3/uL (ref 0.7–4.0)
MCH: 28.7 pg (ref 26.0–34.0)
MCHC: 34.1 g/dL (ref 30.0–36.0)
MCV: 84 fL (ref 80.0–100.0)
Monocytes Absolute: 0.6 10*3/uL (ref 0.1–1.0)
Monocytes Relative: 10 %
Neutro Abs: 4.2 10*3/uL (ref 1.7–7.7)
Neutrophils Relative %: 70 %
Platelets: 222 10*3/uL (ref 150–400)
RBC: 4.88 MIL/uL (ref 4.22–5.81)
RDW: 12.2 % (ref 11.5–15.5)
WBC: 6.1 10*3/uL (ref 4.0–10.5)
nRBC: 0 % (ref 0.0–0.2)

## 2023-09-09 LAB — PSA: Prostatic Specific Antigen: 0.22 ng/mL (ref 0.00–4.00)

## 2023-09-09 LAB — TSH: TSH: 0.911 u[IU]/mL (ref 0.350–4.500)

## 2023-09-09 MED ORDER — SODIUM CHLORIDE 0.9 % IV BOLUS
1000.0000 mL | Freq: Once | INTRAVENOUS | Status: AC
  Administered 2023-09-09: 1000 mL via INTRAVENOUS

## 2023-09-09 MED ORDER — KETOROLAC TROMETHAMINE 15 MG/ML IJ SOLN
15.0000 mg | Freq: Once | INTRAMUSCULAR | Status: AC
  Administered 2023-09-09: 15 mg via INTRAVENOUS
  Filled 2023-09-09: qty 1

## 2023-09-09 NOTE — ED Triage Notes (Signed)
 BIL shoulder and neck pain for 1 week. States pain is worse when moving neck. Unknown injury.  Denies fevers, headaches  Seen at ED 1 week ago

## 2023-09-09 NOTE — Discharge Instructions (Addendum)
 The pain appears to be more muscular.  Potentially could be related to some dehydration or the tap that you had in your spine.  Your sodium is a little lower but not at a dangerous level.  Continue the medicines were given including the muscle relaxer.  Follow-up with the doctor as needed.  The blood work that she had ordered has been drawn but has not all resulted yet.

## 2023-09-09 NOTE — ED Provider Notes (Signed)
 [facility] Provider Note   CSN: [id number] Arrival date & time: 09/09/23  0820     History  Chief Complaint  Patient presents with   Shoulder Pain    BIL    Jonathan Lucero is a 38 y.o. male.   Shoulder Pain Patient presents with shoulder pain and neck pain.  Has had for around a week now.  Recently diagnosed with viral meningitis.  Had lumbar puncture done on the 27th.  Has followed up with PCP.  Now states decreasing fevers but still having pain in his neck.  States pain in his neck is got more severe.  Of note was started on prednisone  on the 29th by his PCP for his intractable headache.  Patient states his headache is actually feeling better.     Home Medications Prior to Admission medications   Medication Sig Start Date End Date Taking? Authorizing Provider  methocarbamol  (ROBAXIN ) 500 MG tablet Take 1 tablet (500 mg total) by mouth every 8 (eight) hours as needed for muscle spasms. 09/03/23   Long, Shereen Dike, MD  predniSONE  (DELTASONE ) 20 MG tablet Take 1 tablet (20 mg total) by mouth daily with breakfast. 09/05/23   Everlina Hock, NP      Allergies    Ativan [lorazepam]    Review of Systems   Review of Systems  Physical Exam Updated Vital Signs BP (!) 143/86   Pulse (!) 105   Temp 98.4 F (36.9 C) (Oral)   Resp 18   Wt 47.6 kg   SpO2 100%   BMI 15.73 kg/m  Physical Exam Vitals reviewed.  HENT:     Head: Normocephalic.  Neck:     Comments: Does have some tenderness over musculature of neck.  Somewhat decreased range of motion of neck. Chest:     Chest wall: No tenderness.  Abdominal:     Tenderness: There is no abdominal tenderness.  Musculoskeletal:        General: No tenderness.  Skin:    General: Skin is warm.  Neurological:     Mental Status: He is alert and oriented to person, place, and time.     ED Results / Procedures / Treatments   Labs (all labs ordered are listed, but only abnormal results  are displayed) Labs Reviewed  CBC WITH DIFFERENTIAL/PLATELET  PSA  PARATHYROID HORMONE, INTACT (NO CA)  SICKLE CELL SCREEN  PROTEIN ELECTROPHORESIS, SERUM  TSH  BASIC METABOLIC PANEL WITH GFR    EKG None  Radiology No results found.  Procedures Procedures    Medications Ordered in ED Medications  ketorolac (TORADOL) 15 MG/ML injection 15 mg (has no administration in time range)  sodium chloride  0.9 % bolus 1,000 mL (has no administration in time range)    ED Course/ Medical Decision Making/ A&P                                 Medical Decision Making Amount and/or Complexity of Data Reviewed Labs: ordered.  Risk Prescription drug management.   Patient with neck pain.  Recently diagnosed with viral meningitis.  Reviewed notes from ER and PCP.  Had been started on prednisone .  Now more pain in his neck.  States he been given muscle relaxers.  States fevers are decreasing.  No confusion.  States headache is doing better.  Differential diagnosis does include musculoskeletal pain.  Not positional.  With headache improving  I feel this is less likely spinal headache.  Persistent meningitis considered.  Also new bacterial meningitis considered.  Will get blood work.  Will get also blood work PCP had ordered but had not been done yet.  Was due to have lab appointment today.  Will treat symptomatically.  Feeling better after treatment.  Has a mild hyponatremia but not severe.  White count also normal.  Patient states he did not started the prednisone . Had been given muscle relaxers at PCP office.  Fluid bolus given here and feeling better.  I think stable for discharge home.  Will discharge.          Final Clinical Impression(s) / ED Diagnoses Final diagnoses:  None    Rx / DC Orders ED Discharge Orders     None         Mozell Arias, MD 09/09/23 1112

## 2023-09-10 LAB — SICKLE CELL SCREEN: Sickle Cell Screen: NEGATIVE

## 2023-09-11 LAB — PROTEIN ELECTROPHORESIS, SERUM
A/G Ratio: 1 (ref 0.7–1.7)
Albumin ELP: 4.1 g/dL (ref 2.9–4.4)
Alpha-1-Globulin: 0.2 g/dL (ref 0.0–0.4)
Alpha-2-Globulin: 1.1 g/dL — ABNORMAL HIGH (ref 0.4–1.0)
Beta Globulin: 1.1 g/dL (ref 0.7–1.3)
Gamma Globulin: 1.7 g/dL (ref 0.4–1.8)
Globulin, Total: 4.1 g/dL — ABNORMAL HIGH (ref 2.2–3.9)
Total Protein ELP: 8.2 g/dL (ref 6.0–8.5)

## 2023-09-11 LAB — PARATHYROID HORMONE, INTACT (NO CA): PTH: 47 pg/mL (ref 15–65)

## 2023-09-13 ENCOUNTER — Ambulatory Visit: Admitting: Family Medicine

## 2023-09-13 ENCOUNTER — Telehealth: Payer: Self-pay | Admitting: Neurology

## 2023-09-13 NOTE — Telephone Encounter (Signed)
 Called patient to schedule appt for him to follow up with Carolynne Citron next week and make sure he gets his ultrasound done. Emphasized importance of following up on these abnormal findings. 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 He will call to let us  know on Monday.   Carolynne Citron - FYI.

## 2023-09-23 ENCOUNTER — Encounter: Payer: Self-pay | Admitting: Family Medicine

## 2023-09-23 NOTE — Telephone Encounter (Signed)
 I just sent him a MyChart message to check in and see how he was doing. Really would like him to schedule an appointment soon and get that ultrasound done.

## 2023-09-30 NOTE — Telephone Encounter (Signed)
 Called and spoke with patient. He states he is okay. He has been out of town and on his way back. He will call to schedule when he is back in town.   Waddell - FYI.

## 2023-10-03 ENCOUNTER — Encounter: Payer: Self-pay | Admitting: Dietician

## 2023-10-03 ENCOUNTER — Encounter: Attending: Family Medicine | Admitting: Dietician

## 2023-10-03 VITALS — Ht 68.5 in | Wt 97.0 lb

## 2023-10-03 DIAGNOSIS — Z713 Dietary counseling and surveillance: Secondary | ICD-10-CM | POA: Diagnosis not present

## 2023-10-03 DIAGNOSIS — R636 Underweight: Secondary | ICD-10-CM | POA: Insufficient documentation

## 2023-10-03 DIAGNOSIS — Z681 Body mass index (BMI) 19 or less, adult: Secondary | ICD-10-CM | POA: Insufficient documentation

## 2023-10-03 NOTE — Progress Notes (Signed)
 Medical Nutrition Therapy  Appointment Start time:  1040  Appointment End time:  1145  Primary concerns today: recent sickness, leading to some weight loss.  Referral diagnosis: R63.6 (underweight) Preferred learning style: no preference indicated (auditory, visual, hands on, no preference indicated) Learning readiness: preparation (not ready, contemplating, ready, change in progress)  NUTRITION ASSESSMENT  Anthropometrics  Height: 68.5 in   Body Composition Scale 10/03/2023  Current Body Weight 97.0  Total Body Fat % 7.5  Visceral Fat 1  Fat-Free Mass % 92.5   Total Body Water % 73.0  Muscle-Mass lbs 18.5  BMI 14.4  Body Fat Displacement          Torso  lbs 4.4         Left Leg  lbs 0.8         Right Leg  lbs 0.8         Left Arm  lbs 0.4         Right Arm   bs 0.4   Clinical Medical Hx: cancer (35 years ago; 38 years old) Medications: robaxin , prednisone  (has not started) Labs: sodium 129, chloride 93, BUN 29, glucose 102, creatinine, Ser 1.58, GFR 56, A1c 6.2 Notable Signs/Symptoms: none noted  Lifestyle & Dietary Hx  Pt states he has been sick for several weeks, stating he PCP referred him to get some education to gain weight. Pt states it is a lot of work to eat enough calories. 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 Pt states he is also working at a [employer]. 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  Estimated daily fluid intake: 32-64 oz Supplements: apple cider vinegar gummies Sleep: good Stress / self-care: 2-3 on a scale of 1-10; video games, listen to music Current average weekly physical activity: ADLs  24-Hr Dietary Recall First Meal: skip or [place] sausage biscuit or toast with jelly or smoothie Snack:  Second Meal: rice and chicken, salad, pita bread (gets food Snack:  Third Meal: left overs from lunch or sandwich Snack:   Beverages: coffee, water, juice, soda  NUTRITION DIAGNOSIS  NB-1.1 Food and nutrition-related knowledge deficit As related to recent sickness and weight loss.  As evidenced by food recall and first visit of this type.  NUTRITION INTERVENTION  Nutrition education (E-1) on the following topics:  To effectively increase calorie and protein intake, focus on nutrient-dense additions to your existing meals and snacks. Incorporate sources of healthy fats like olive oil, avocado, nut butters, nuts, and seeds into dishes, smoothies, and spreads. For protein, aim to include lean meats, fish, poultry, eggs, dairy products (like [place] yogurt, cheese, and milk), legumes, and protein powders. Simple tips include cooking with whole milk instead of water, adding cheese to vegetables and soups, blending nut butters into oatmeal, and snacking on hard-boiled eggs or cottage cheese. Avoid skipping meals; include snacks; aim for a protein with each meal or snack. Fruits & Vegetables: Aim to fill half your plate with a variety of fruits and vegetables. They are rich in vitamins, minerals, and fiber, and can help reduce the risk of chronic diseases. Choose a colorful assortment of fruits and vegetables to ensure you get a wide range of nutrients. Grains and Starches: Make at least half of your grain choices whole grains, such as Nivan Melendrez rice, whole wheat bread, and oats. Whole grains provide fiber, which aids in  digestion and healthy cholesterol levels. Aim for whole forms of starchy vegetables such as potatoes, sweet potatoes, beans, peas, and corn, which are fiber rich and provide many vitamins and minerals.  Physical Activity: Aim for 60 minutes of physical activity daily. Regular physical activity promotes overall health-including helping to reduce risk for heart disease and diabetes, promoting mental health, and helping us  sleep better.   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  Goals Established by Pt Eat every 3-5 hours; avoid skipping meals; include snacks Include protein with every meal or snack  MONITORING & EVALUATION Dietary intake, weekly physical activity, body composition.  Next Steps  Patient is to return in 2 months for follow-up.

## 2023-11-12 ENCOUNTER — Telehealth: Payer: Self-pay

## 2023-11-12 NOTE — Telephone Encounter (Signed)
 Left voicemail to call office back to see if referral was completed.   Curtistine Quiet, CMA

## 2023-12-06 ENCOUNTER — Ambulatory Visit: Admitting: Dietician

## 2023-12-23 ENCOUNTER — Encounter: Attending: Family Medicine | Admitting: Dietician

## 2023-12-23 ENCOUNTER — Encounter: Payer: Self-pay | Admitting: Dietician

## 2023-12-23 VITALS — Ht 68.5 in | Wt 111.1 lb

## 2023-12-23 DIAGNOSIS — Z681 Body mass index (BMI) 19 or less, adult: Secondary | ICD-10-CM | POA: Diagnosis not present

## 2023-12-23 DIAGNOSIS — Z713 Dietary counseling and surveillance: Secondary | ICD-10-CM | POA: Insufficient documentation

## 2023-12-23 DIAGNOSIS — R636 Underweight: Secondary | ICD-10-CM | POA: Insufficient documentation

## 2023-12-23 NOTE — Progress Notes (Signed)
 Medical Nutrition Therapy Follow-up  Appointment Start time:  1129  Appointment End time:  1205  Primary concerns today: recent sickness, leading to some weight loss.  Referral diagnosis: R63.6 (underweight) Preferred learning style: no preference indicated (auditory, visual, hands on, no preference indicated) Learning readiness: preparation (not ready, contemplating, ready, change in progress)  NUTRITION ASSESSMENT  Anthropometrics  Height: 68.5 in   Body Composition Scale 10/03/2023 12/23/2023  Current Body Weight 97.0 111.1  Total Body Fat % 7.5 7.5  Visceral Fat 1 1  Fat-Free Mass % 92.5 92.5   Total Body Water % 73.0 73.0  Muscle-Mass lbs 18.5 21.2  BMI 14.4 16.5  Body Fat Displacement           Torso  lbs 4.4 5.1         Left Leg  lbs 0.8 1.0         Right Leg  lbs 0.8 1.0         Left Arm  lbs 0.4 0.5         Right Arm   bs 0.4 0.5   Clinical Medical Hx: cancer (35 years ago; 38 years old) Medications: robaxin , prednisone  (has not started) Labs: sodium 129, chloride 93, BUN 29, glucose 102, creatinine, Ser 1.58, GFR 56, A1c 6.2 Notable Signs/Symptoms: none noted  Lifestyle & Dietary Hx  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 Pt states he got a cooler last week to help with meal prepping. Pt states he tried avocado. Pt states he feels better and energy level is higher. Pt states he has used a protein powder/shakes sometimes. 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 Pt doing great.  Estimated daily fluid intake: 32-64 oz Supplements: apple cider vinegar gummies Sleep: good Stress / self-care: 2-3 on a scale of 1-10; video games, listen to music Current average weekly physical activity: 10 and 20 lbs dumb bells, twice a day for 3-4 days per week.  NUTRITION DIAGNOSIS  NB-1.1 Food and nutrition-related knowledge deficit As related to recent sickness  and weight loss.  As evidenced by food recall and first visit of this type.  NUTRITION INTERVENTION  Nutrition education (E-1) on the following topics:  Physical activity plays a vital role in adult health by improving cardiovascular function, enhancing mood, and supporting metabolic health. For individuals aiming to gain weight, incorporating resistance exercises--such as weightlifting or bodyweight training--is especially important. These exercises stimulate muscle growth by challenging muscle fibers, promoting hypertrophy, and increasing lean body mass. This not only contributes to healthy weight gain but also improves strength, posture, and overall physical function. Combining regular resistance training with adequate nutrition ensures that weight gained is primarily muscle rather than fat. Gaining weight in a healthy way requires more than just increasing calories--it's about nourishing your body with balanced meals and snacks that provide essential nutrients. Eating a variety of foods ensures you get the vitamins and minerals needed for overall health. 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 Meal planning and prepping ahead of time can make a big difference. Using a cooler to store nutritious options like lean proteins, whole grains, fruits, and vegetables helps keep healthy choices within reach, even on the road.  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  Goals Established by Pt Continue: Eat every 3-5 hours; avoid skipping meals; include snacks Continue: Include protein with every meal or snack Continue: Physical activity, using the light  weights New: use your new cooler to help with meal planning and prepping.  MONITORING & EVALUATION Dietary intake, weekly physical activity, body composition.  Next Steps  Patient is to return in 2-3 months for follow-up.

## 2024-02-14 ENCOUNTER — Telehealth: Payer: Self-pay

## 2024-02-14 NOTE — Telephone Encounter (Signed)
 LMOM informing Pt that we received notification that he did not see nephrology. Waddell would still like for him to be seen, also due for follow-up here. Asked that he call office to schedule.

## 2024-03-23 ENCOUNTER — Ambulatory Visit: Admitting: Dietician
